# Patient Record
Sex: Female | Born: 1955 | Race: White | Hispanic: No | Marital: Married | Smoking: Former smoker
Health system: Southern US, Community
[De-identification: ages and names within clinical notes are randomized; demographics above are authoritative.]

## PROBLEM LIST (undated history)

## (undated) DIAGNOSIS — R42 Dizziness and giddiness: Secondary | ICD-10-CM

## (undated) DIAGNOSIS — N920 Excessive and frequent menstruation with regular cycle: Secondary | ICD-10-CM

## (undated) DIAGNOSIS — G43909 Migraine, unspecified, not intractable, without status migrainosus: Secondary | ICD-10-CM

## (undated) DIAGNOSIS — N951 Menopausal and female climacteric states: Secondary | ICD-10-CM

## (undated) DIAGNOSIS — R12 Heartburn: Secondary | ICD-10-CM

## (undated) HISTORY — PX: DILATION AND CURETTAGE OF UTERUS: SHX78

## (undated) HISTORY — PX: TONSILLECTOMY: SUR1361

## (undated) HISTORY — DX: Excessive and frequent menstruation with regular cycle: N92.0

## (undated) HISTORY — DX: Migraine, unspecified, not intractable, without status migrainosus: G43.909

## (undated) HISTORY — DX: Menopausal and female climacteric states: N95.1

## (undated) HISTORY — DX: Dizziness and giddiness: R42

## (undated) HISTORY — DX: Heartburn: R12

---

## 2004-11-26 ENCOUNTER — Emergency Department (HOSPITAL_COMMUNITY): Admission: EM | Admit: 2004-11-26 | Discharge: 2004-11-26 | Payer: Self-pay | Admitting: Emergency Medicine

## 2009-04-24 ENCOUNTER — Other Ambulatory Visit: Admission: RE | Admit: 2009-04-24 | Discharge: 2009-04-24 | Payer: Self-pay | Admitting: Obstetrics and Gynecology

## 2009-10-01 ENCOUNTER — Ambulatory Visit (HOSPITAL_COMMUNITY): Admission: RE | Admit: 2009-10-01 | Discharge: 2009-10-01 | Payer: Self-pay | Admitting: Interventional Cardiology

## 2009-10-01 ENCOUNTER — Encounter (INDEPENDENT_AMBULATORY_CARE_PROVIDER_SITE_OTHER): Payer: Self-pay | Admitting: Interventional Cardiology

## 2009-10-03 ENCOUNTER — Inpatient Hospital Stay (HOSPITAL_BASED_OUTPATIENT_CLINIC_OR_DEPARTMENT_OTHER): Admission: RE | Admit: 2009-10-03 | Discharge: 2009-10-03 | Payer: Self-pay | Admitting: Interventional Cardiology

## 2009-10-07 ENCOUNTER — Ambulatory Visit: Payer: Self-pay | Admitting: Thoracic Surgery (Cardiothoracic Vascular Surgery)

## 2009-10-26 HISTORY — PX: MITRAL VALVE REPAIR: SHX2039

## 2009-10-31 ENCOUNTER — Encounter: Payer: Self-pay | Admitting: Thoracic Surgery (Cardiothoracic Vascular Surgery)

## 2009-11-03 ENCOUNTER — Encounter
Admission: RE | Admit: 2009-11-03 | Discharge: 2009-11-03 | Payer: Self-pay | Admitting: Thoracic Surgery (Cardiothoracic Vascular Surgery)

## 2009-11-03 ENCOUNTER — Ambulatory Visit: Payer: Self-pay | Admitting: Thoracic Surgery (Cardiothoracic Vascular Surgery)

## 2009-11-04 ENCOUNTER — Inpatient Hospital Stay (HOSPITAL_COMMUNITY)
Admission: RE | Admit: 2009-11-04 | Discharge: 2009-11-08 | Payer: Self-pay | Admitting: Thoracic Surgery (Cardiothoracic Vascular Surgery)

## 2009-11-04 ENCOUNTER — Ambulatory Visit: Payer: Self-pay | Admitting: Thoracic Surgery (Cardiothoracic Vascular Surgery)

## 2009-11-04 ENCOUNTER — Encounter: Payer: Self-pay | Admitting: Thoracic Surgery (Cardiothoracic Vascular Surgery)

## 2009-11-17 ENCOUNTER — Encounter
Admission: RE | Admit: 2009-11-17 | Discharge: 2009-11-17 | Payer: Self-pay | Admitting: Thoracic Surgery (Cardiothoracic Vascular Surgery)

## 2009-11-17 ENCOUNTER — Ambulatory Visit: Payer: Self-pay | Admitting: Thoracic Surgery (Cardiothoracic Vascular Surgery)

## 2009-12-07 ENCOUNTER — Encounter: Admission: RE | Admit: 2009-12-07 | Discharge: 2009-12-07 | Payer: Self-pay | Admitting: Family Medicine

## 2010-01-16 ENCOUNTER — Ambulatory Visit: Payer: Self-pay | Admitting: Thoracic Surgery (Cardiothoracic Vascular Surgery)

## 2010-04-16 ENCOUNTER — Encounter: Admission: RE | Admit: 2010-04-16 | Discharge: 2010-04-16 | Payer: Self-pay | Admitting: Neurology

## 2010-04-20 ENCOUNTER — Ambulatory Visit: Payer: Self-pay | Admitting: Thoracic Surgery (Cardiothoracic Vascular Surgery)

## 2010-06-18 ENCOUNTER — Other Ambulatory Visit
Admission: RE | Admit: 2010-06-18 | Discharge: 2010-06-18 | Payer: Self-pay | Source: Home / Self Care | Admitting: Obstetrics and Gynecology

## 2010-07-13 ENCOUNTER — Encounter
Admission: RE | Admit: 2010-07-13 | Discharge: 2010-07-13 | Payer: Self-pay | Source: Home / Self Care | Attending: Obstetrics and Gynecology | Admitting: Obstetrics and Gynecology

## 2010-07-19 ENCOUNTER — Encounter: Payer: Self-pay | Admitting: Family Medicine

## 2010-09-15 LAB — TYPE AND SCREEN: Antibody Screen: NEGATIVE

## 2010-09-15 LAB — POCT I-STAT 4, (NA,K, GLUC, HGB,HCT)
Glucose, Bld: 114 mg/dL — ABNORMAL HIGH (ref 70–99)
Glucose, Bld: 122 mg/dL — ABNORMAL HIGH (ref 70–99)
Glucose, Bld: 132 mg/dL — ABNORMAL HIGH (ref 70–99)
HCT: 22 % — ABNORMAL LOW (ref 36.0–46.0)
HCT: 22 % — ABNORMAL LOW (ref 36.0–46.0)
HCT: 24 % — ABNORMAL LOW (ref 36.0–46.0)
HCT: 28 % — ABNORMAL LOW (ref 36.0–46.0)
HCT: 34 % — ABNORMAL LOW (ref 36.0–46.0)
Hemoglobin: 11.6 g/dL — ABNORMAL LOW (ref 12.0–15.0)
Hemoglobin: 8.2 g/dL — ABNORMAL LOW (ref 12.0–15.0)
Hemoglobin: 9.5 g/dL — ABNORMAL LOW (ref 12.0–15.0)
Potassium: 3.5 mEq/L (ref 3.5–5.1)
Potassium: 3.5 mEq/L (ref 3.5–5.1)
Potassium: 3.6 mEq/L (ref 3.5–5.1)
Sodium: 141 mEq/L (ref 135–145)
Sodium: 141 mEq/L (ref 135–145)
Sodium: 145 mEq/L (ref 135–145)

## 2010-09-15 LAB — BASIC METABOLIC PANEL
BUN: 14 mg/dL (ref 6–23)
BUN: 6 mg/dL (ref 6–23)
CO2: 23 mEq/L (ref 19–32)
CO2: 28 mEq/L (ref 19–32)
Calcium: 8.2 mg/dL — ABNORMAL LOW (ref 8.4–10.5)
Chloride: 113 mEq/L — ABNORMAL HIGH (ref 96–112)
Creatinine, Ser: 0.77 mg/dL (ref 0.4–1.2)
GFR calc Af Amer: >60 mL/min (ref >60)
GFR calc non Af Amer: >60 mL/min (ref >60)
Glucose, Bld: 109 mg/dL — ABNORMAL HIGH (ref 70–99)
Glucose, Bld: 126 mg/dL — ABNORMAL HIGH (ref 70–99)
Potassium: 4 mEq/L (ref 3.5–5.1)
Potassium: 4.2 mEq/L (ref 3.5–5.1)
Sodium: 139 mEq/L (ref 135–145)

## 2010-09-15 LAB — PROTIME-INR
INR: 1.28 (ref 0.00–1.49)
Prothrombin Time: 12.6 seconds (ref 11.6–15.2)
Prothrombin Time: 15.6 seconds — ABNORMAL HIGH (ref 11.6–15.2)
Prothrombin Time: 15.9 seconds — ABNORMAL HIGH (ref 11.6–15.2)

## 2010-09-15 LAB — CBC
HCT: 23.4 % — ABNORMAL LOW (ref 36.0–46.0)
HCT: 23.9 % — ABNORMAL LOW (ref 36.0–46.0)
HCT: 24.3 % — ABNORMAL LOW (ref 36.0–46.0)
HCT: 24.5 % — ABNORMAL LOW (ref 36.0–46.0)
HCT: 27.8 % — ABNORMAL LOW (ref 36.0–46.0)
HCT: 34.7 % — ABNORMAL LOW (ref 36.0–46.0)
Hemoglobin: 7.9 g/dL — ABNORMAL LOW (ref 12.0–15.0)
Hemoglobin: 8 g/dL — ABNORMAL LOW (ref 12.0–15.0)
Hemoglobin: 9.4 g/dL — ABNORMAL LOW (ref 12.0–15.0)
MCHC: 33.6 g/dL (ref 30.0–36.0)
MCHC: 33.8 g/dL (ref 30.0–36.0)
MCV: 85.8 fL (ref 78.0–100.0)
MCV: 86.7 fL (ref 78.0–100.0)
MCV: 87.1 fL (ref 78.0–100.0)
MCV: 87.5 fL (ref 78.0–100.0)
MCV: 87.9 fL (ref 78.0–100.0)
Platelets: 110 10*3/uL — ABNORMAL LOW (ref 150–400)
Platelets: 113 10*3/uL — ABNORMAL LOW (ref 150–400)
Platelets: 291 10*3/uL (ref 150–400)
RBC: 2.72 MIL/uL — ABNORMAL LOW (ref 3.87–5.11)
RBC: 3.2 MIL/uL — ABNORMAL LOW (ref 3.87–5.11)
RBC: 4.04 MIL/uL (ref 3.87–5.11)
RDW: 14.3 % (ref 11.5–15.5)
RDW: 14.7 % (ref 11.5–15.5)
RDW: 14.8 % (ref 11.5–15.5)
WBC: 11.8 10*3/uL — ABNORMAL HIGH (ref 4.0–10.5)
WBC: 4.9 10*3/uL (ref 4.0–10.5)
WBC: 7 10*3/uL (ref 4.0–10.5)
WBC: 7.5 10*3/uL (ref 4.0–10.5)

## 2010-09-15 LAB — CREATININE, SERUM
GFR calc Af Amer: >60 mL/min (ref >60)
GFR calc Af Amer: >60 mL/min (ref >60)

## 2010-09-15 LAB — COMPREHENSIVE METABOLIC PANEL
BUN: 10 mg/dL (ref 6–23)
CO2: 20 mEq/L (ref 19–32)
Chloride: 111 mEq/L (ref 96–112)
Creatinine, Ser: 0.78 mg/dL (ref 0.4–1.2)
GFR calc non Af Amer: >60 mL/min (ref >60)
Glucose, Bld: 92 mg/dL (ref 70–99)
Total Bilirubin: 0.3 mg/dL (ref 0.3–1.2)

## 2010-09-15 LAB — POCT I-STAT GLUCOSE
Glucose, Bld: 112 mg/dL — ABNORMAL HIGH (ref 70–99)
Glucose, Bld: 155 mg/dL — ABNORMAL HIGH (ref 70–99)

## 2010-09-15 LAB — POCT I-STAT 3, ART BLOOD GAS (G3+)
Acid-base deficit: 5 mmol/L — ABNORMAL HIGH (ref 0.0–2.0)
Bicarbonate: 22.3 mEq/L (ref 20.0–24.0)
Patient temperature: 35.4
Patient temperature: 36.8
TCO2: 22 mmol/L (ref 0–100)
TCO2: 24 mmol/L (ref 0–100)
pCO2 arterial: 40.2 mmHg (ref 35.0–45.0)
pH, Arterial: 7.282 — ABNORMAL LOW (ref 7.350–7.400)
pH, Arterial: 7.327 — ABNORMAL LOW (ref 7.350–7.400)

## 2010-09-15 LAB — HEMOGLOBIN A1C
Hgb A1c MFr Bld: 5.6 % (ref <5.7)
Mean Plasma Glucose: 114 mg/dL (ref <117)

## 2010-09-15 LAB — POCT I-STAT, CHEM 8
BUN: 11 mg/dL (ref 6–23)
Calcium, Ion: 1.21 mmol/L (ref 1.12–1.32)
Chloride: 112 mEq/L (ref 96–112)
Glucose, Bld: 139 mg/dL — ABNORMAL HIGH (ref 70–99)
TCO2: 22 mmol/L (ref 0–100)

## 2010-09-15 LAB — URINALYSIS, ROUTINE W REFLEX MICROSCOPIC
Bilirubin Urine: NEGATIVE
Hgb urine dipstick: NEGATIVE
Specific Gravity, Urine: 1.027 (ref 1.005–1.030)
pH: 7 (ref 5.0–8.0)

## 2010-09-15 LAB — BLOOD GAS, ARTERIAL
Bicarbonate: 21.7 mEq/L (ref 20.0–24.0)
Patient temperature: 98.6
TCO2: 22.6 mmol/L (ref 0–100)
pH, Arterial: 7.481 — ABNORMAL HIGH (ref 7.350–7.400)

## 2010-09-15 LAB — GLUCOSE, CAPILLARY
Glucose-Capillary: 109 mg/dL — ABNORMAL HIGH (ref 70–99)
Glucose-Capillary: 121 mg/dL — ABNORMAL HIGH (ref 70–99)
Glucose-Capillary: 144 mg/dL — ABNORMAL HIGH (ref 70–99)
Glucose-Capillary: 62 mg/dL — ABNORMAL LOW (ref 70–99)

## 2010-09-15 LAB — MAGNESIUM: Magnesium: 2.3 mg/dL (ref 1.5–2.5)

## 2010-09-15 LAB — ABO/RH: ABO/RH(D): O POS

## 2010-09-15 LAB — PLATELET COUNT: Platelets: 173 10*3/uL (ref 150–400)

## 2010-09-16 LAB — POCT I-STAT 3, VENOUS BLOOD GAS (G3P V)
Bicarbonate: 23.3 mEq/L (ref 20.0–24.0)
TCO2: 24 mmol/L (ref 0–100)
pCO2, Ven: 33.3 mmHg — ABNORMAL LOW (ref 45.0–50.0)
pCO2, Ven: 38.6 mmHg — ABNORMAL LOW (ref 45.0–50.0)
pH, Ven: 7.389 — ABNORMAL HIGH (ref 7.250–7.300)
pH, Ven: 7.434 — ABNORMAL HIGH (ref 7.250–7.300)

## 2010-11-10 NOTE — Assessment & Plan Note (Signed)
OFFICE VISIT   Melinda Bradley, Melinda Bradley  DOB:  Mar 15, 1956                                        April 20, 2010  CHART #:  [id number]   HISTORY OF PRESENT ILLNESS:  The patient returns for followup status  post right miniature thoracotomy for mitral valve repair on Nov 04, 2009.  She was last seen here in the office on January 16, 2010, at which  time, she was doing well.  She was having lots of problems with dizzy  spells and ultimately she was evaluated comprehensively by Dr. Kelli Hope at [facility].  She has been diagnosed  with, felt to be probable silent migraines and treated with Topamax.  She also changed her job at work and this has decreased the amount of  stress in her life.  Since then, she is doing much better and feeling  much better overall.  She denies any shortness of breath.  She has not  had any tachy palpitations.  She has not had any chest pain.  Her  exercise tolerance is good and she wants to go back to strenuous  physical activity on a regular basis.  Overall, she is doing much better  now and feels much better.  The remainder of her review of systems is  unremarkable.  The remainder of her past medical history is unchanged.  She reportedly had a followup echocardiogram performed at Dr. Hoyle Barr  office.  The results of this are not currently available.  She was told  that her heart and valve look very good.   PHYSICAL EXAMINATION:  Notable for a well-appearing female with blood  pressure 145/95, pulse 67, and oxygen saturation 100%.  Examination of  the chest reveals clear breath sounds that are symmetrical bilaterally.  Her mini thoracotomy incision has healed nicely.  Auscultation of the  heart is notable for regular rate and rhythm with crisp heart sounds.  No murmurs, rubs, or gallops are noted.  The abdomen is soft and  nontender.  The extremities are warm and well perfused.   IMPRESSION:  The patient is  doing well approximately 5 months status  post mitral valve repair.   PLAN:  In the future, the patient will call and return to see [place] as  needed.  All of her questions have been addressed.   Salvatore Decent. Cornelius Moras, M.D.  Electronically Signed   CHO/MEDQ  D:  04/20/2010  T:  04/20/2010  Job:  [id number]   cc:   Corky Crafts, MD  Sigmund Hazel, M.D.  Michael L. Thad Ranger, M.D.

## 2010-11-10 NOTE — Assessment & Plan Note (Signed)
OFFICE VISIT   Melinda Bradley, Melinda Bradley  DOB:  [birth year]                                        January 16, 2010  CHART #:  [id number]   HISTORY OF PRESENT ILLNESS:  The patient returns for followup status  post right miniature thoracotomy for mitral valve repair on Nov 04, 2009.  She has done very well postoperatively and was last seen here in  the office on Nov 17, 2009.  Since then, she has done extremely well.  She is delighted with her progress and she reports now that she did not  realize how much trouble she was having prior to surgery.  She states  that her exercise tolerance is much improved and she is eager to  increase her activity further.  She did have more of her dizzy spells  early after surgery and ultimately she was seen by a neurologist.  She  is diagnosed with what is felt to be silent migraine headaches and she  has just recently been started on Topamax.  She otherwise feels fine.  She has no shortness of breath.  She has no tachy palpitations.  The  remainder of her review of systems is unremarkable.   PHYSICAL EXAMINATION:  Notable for well-appearing female with blood  pressure 144/97, pulse 80 and regular, and oxygen saturation 98% on room  air.  Examination of the chest reveals a mini thoracotomy incision that  has healed nicely.  Auscultation reveals clear breath sounds that are  symmetrical bilaterally.  Cardiovascular exam includes regular rate and  rhythm.  No murmurs, rubs, or gallops noted.  The abdomen is soft and  nontender.  The extremities are warm and well perfused.  There is no  lower extremity edema.   IMPRESSION:  The patient has done very well following mitral valve  repair.   PLAN:  I have encouraged the patient to continue to increase her  physical activity without any particular limitations at this time.  All  of her questions have been addressed.  We will plan to see her back in 3  months for followup.  At some point  before then, we would like to see a  followup  echocardiogram, although she clearly is doing well and she does not have  a murmur on physical exam.   Salvatore Decent. Cornelius Moras, M.D.  Electronically Signed   CHO/MEDQ  D:  01/16/2010  T:  01/17/2010  Job:  [id number]   cc:   Corky Crafts, MD  Sigmund Hazel, M.D.

## 2010-11-10 NOTE — H&P (Signed)
HISTORY AND PHYSICAL EXAMINATION   October 07, 2009   Re:  Melinda Bradley, Melinda Bradley        DOB:  1955/07/24   REASON FOR CONSULTATION:  Severe mitral regurgitation.   HISTORY OF PRESENT ILLNESS:  The patient is a 55 year old married white  female from [place] with history of mitral valve prolapse and mitral  regurgitation.  She originally presented 2 years ago, prior to which  time she had a total of 2 severe dizzy spells including one in 2007 and  one in 2009.  She underwent an extensive workup but no obvious  explanation for her severe dizzy spells was ever identified.  Following  her workup in 2009 she was noted to have a heart murmur on physical  exam.  An echocardiogram was performed demonstrating mitral valve  prolapse with severe mitral regurgitation.  She was initially seen by a  cardiologist at [facility], and long-term medical  therapy was recommended.  More recently, she was seen by her primary  care physician here in [place], Dr. Sigmund Bradley.  She was referred to  Dr. Lance Bradley for follow up of mitral valve prolapse with severe  mitral regurgitation.  Transthoracic echocardiogram was performed in  their office, confirming the presence of mitral valve prolapse with  severe mitral regurgitation.  The patient subsequently underwent  transesophageal echocardiogram on October 01, 2009.  This confirmed the  presence of a flail segment of the posterior leaflet with severe (4+)  mitral regurgitation.  Left ventricular cavity size was normal.  Left  ventricular systolic function is normal.  No other abnormalities were  noted.  The patient subsequently underwent left and right heart  catheterization by Dr. Eldridge Bradley on October 03, 2009.  This demonstrated the  presence of normal coronary artery anatomy with no significant coronary  artery disease.  Pulmonary artery pressures measured 27/19 with  pulmonary capillary wedge pressure of 17.   Aortogram of the descending  thoracic and abdominal aorta was notable for the absence of any  significant aortoiliac disease.  Left ventricular function was normal,  but there was severe (4+) mitral regurgitation.  The patient has been  referred for possible elective mitral valve repair.  Of note, over the  last several months the patient has developed progressive symptoms of  exertional shortness of breath.  Initially, she noted only shortness of  breath with relatively strenuous activity such as hurrying or walking at  a fast pace while she is at work.  However, over the last 2 weeks she  also developed some resting shortness of breath and orthopnea.  For  several nights, she had sleep sitting straight up in a chair.  She was  started on oral diuretic therapy, and her shortness of breath has  improved.   REVIEW OF SYSTEMS:  GENERAL:  The patient otherwise reports feeling  well.  She does complain of exertional fatigue.  She has normal  appetite.  She has not been gaining nor losing weight recently.  She is  5 feet 6 inches tall and weighs approximately 152 pounds.  CARDIAC:  Notable for a fairly rapid acceleration of symptoms of  shortness of breath over the last several weeks culminating in episodes  of resting shortness of breath, PND, and orthopnea that have now abated  since she was started on oral diuretic therapy.  She has not had any  lower extremity edema.  She has not had any recent dizzy spells or  syncopal episodes.  She  does have occasional palpitations.  RESPIRATORY:  Notable for the absence of any productive cough,  hemoptysis, wheezing.  GASTROINTESTINAL:  Negative.  The patient has no difficulty swallowing.  She denies hematochezia, hematemesis, melena.  GENITOURINARY:  Negative.  PERIPHERAL VASCULAR:  Negative.  NEUROLOGIC:  Notable for some history of migraine headaches.  She states  that these have been better on long-term hormonal replacement therapy,  but she  still gets 1 or 2 migraine headaches every month.  She has not  had any further dizzy spells since her last major episode in 2009.  She  denies any episodes of transient monocular blindness.  She denies any  episodes of other visual disturbances.  She denies any transient  numbness or weakness involving either upper or lower extremity.  MUSCULOSKELETAL:  Negative.  PSYCHIATRIC:  Negative.  HEENT:  Negative.  The patient has good dentition and sees a dentist on  a regular basis.   PAST MEDICAL HISTORY:  1. Mitral valve prolapse with severe mitral regurgitation.  2. Congestive heart failure, acute systolic.  3. Migraine headaches.   PAST SURGICAL HISTORY:  1. Tonsillectomy.  2. D and C.  3. Bilateral tubal ligation.   Family history is notable in that the patient's sister had mitral valve  repair or replacement many years ago.   SOCIAL HISTORY:  The patient is married and lives with her husband here  in [place].  She works as a Radiographer, therapeutic at [employer].  She has 3 grown children.  She has a long-  standing history of tobacco abuse and reports smoking one-half pack of  cigarettes per day for probably 30 years.  She denies any significant  alcohol consumption.   CURRENT MEDICATIONS:  1. Sumatriptan 100 mg as needed for migraine headache.  2. Imitrex 50 mg as needed for migraine headache.  3. Ortho Micronor 0.35 mg daily.  4. Lasix 40 mg daily (recently started).   DRUG ALLERGIES:  None known.   PHYSICAL EXAM:  The patient is a well-appearing female who appears her  stated age in no acute distress.  Blood pressure 125/84, pulse 80,  oxygen saturation 90s 9% on room air.  HEENT exam is unrevealing.  The  neck is supple.  There is no cervical nor supraclavicular  lymphadenopathy.  There is no jugular venous distention.  No carotid  bruits are noted.  Auscultation of the chest reveals clear breath sounds  which are symmetrical  bilaterally.  No wheezes, rales, or rhonchi are  noted.  Cardiovascular exam demonstrates regular rate and rhythm.  There  is a prominent grade 4/6 holosystolic murmur heard best at the apex with  radiation all across the precordium into the back.  No diastolic murmurs  are noted.  The abdomen is soft, nondistended, nontender.  There are no  palpable masses.  The extremities are warm and well perfused.  There is  no lower extremity edema.  Femoral pulses are palpable.  Distal pulses  are palpable in the posterior tibial position.  The skin is clean, dry,  healthy appearing throughout.  Rectal and GU exams are both deferred.  Neurologic examination is grossly nonfocal and symmetrical throughout.   DIAGNOSTIC TESTS:  Transesophageal echocardiogram performed on October 01, 2009, by Dr. Eldridge Bradley is reviewed.  This demonstrates myxomatous  degenerative disease of the mitral valve with severe prolapse of the  posterior leaflet with a flail segment of the middle scallop (P2) and  severe (4+) essentially wide  open mitral regurgitation.  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  However, the anterior leaflet does  not appear to be prolapsing significantly.  There is normal left  ventricular size and function.  The aortic valve is normal.  There is  trivial tricuspid regurgitation.  No other abnormalities are noted.  Left and right heart catheterization performed on October 03, 2009, is also  reviewed.  Results are as discussed previously.  There is normal  coronary artery anatomy with no significant coronary artery disease.   IMPRESSION:  Mitral valve prolapse with severe mitral regurgitation with  recent progression of symptoms of congestive heart failure.  I agree  that the patient would best be treated with mitral valve repair.  I feel  that she is a good candidate for use of minimally invasive approach.   PLAN:  I  have discussed options at length with the patient here in the  office today.  The rationale for proceeding with surgery has been  discussed.  I am hopeful that her valve should be easily repairable.  If  it cannot be repairable, we would have to replace it using a mechanical  prosthesis, this would be unlikely.  We have discussed the rationale and  associated risks and benefits of minimally invasive approach versus a  conventional median sternotomy.  All of their questions have been  addressed.  She understands and accepts all potential associated risks  of surgery including but not limited to risk of death, stroke,  myocardial infarction, congestive heart failure, respiratory failure,  pneumonia, bleeding requiring blood transfusion, arrhythmia, heart block  with bradycardia requiring permanent pacemaker, late complications  related to valve repair, potential complications related to minimally  invasive approach including femoral artery cannulation.  All of her  questions have been addressed.  I have given her a prescription for  amiodarone to begin 1 week prior to surgery to decrease her risk of  perioperative arrhythmias.  We will plan to see her back in the office  on Nov 03, 2009, and I have asked her to have her husband accompany her  for consultation prior to surgery.  We plan to proceed with surgery on  Tuesday, Nov 04, 2009.  All of her questions have been answered.   Salvatore Decent. Cornelius Moras, M.D.  Electronically Signed   CHO/MEDQ  D:  10/07/2009  T:  10/08/2009  Job:  [id number]

## 2010-11-10 NOTE — Assessment & Plan Note (Signed)
OFFICE VISIT   Melinda Bradley, Melinda Bradley  DOB:  [birth year]                                        Nov 17, 2009  CHART #:  [id number]   HISTORY:  The patient returns to the office today for routine followup  status post right miniature thoracotomy for mitral valve repair on Nov 04, 2009.  She has done very well postoperatively.  Following hospital  discharge, she has continued to do well.  She was seen in followup  earlier today by Dr. Eldridge Dace and she returns to our office for routine  followup as well.  She has had a prothrombin time checked and Coumadin  dose adjusted through Dr. Hoyle Barr office.  She is feeling quite well.  Last week after she went home, she did have an episode where she got  dizzy and lightheaded and felt tired and somewhat confused.  She was  seen in Dr. Hoyle Barr office and noted to be somewhat dehydrated.  Lasix and potassium were stopped at that time.  Since then, she has felt  much better and she has not had any other problems at all.  She has very  mild residual soreness in the right side of her chest.  She only uses a  single Vicodin tablet at night to help her rest.  She is otherwise not  using any pain medicine at all.  Her appetite is good.  She has no  shortness of breath.  Her activity level is continuing to increase.  She  has no complaints at all.   PHYSICAL EXAMINATION:  Notable for well-appearing female with blood  pressure 103/75, pulse 76, and oxygen saturation 99% on room air.  Examination of the chest is notable for miniature thoracotomy incision  that is healing very nicely.  Chest tube sutures are removed.  Breath  sounds are clear to auscultation and symmetrical bilaterally.  No  wheezes, rales, or rhonchi are noted.  Cardiovascular exam includes  regular rate and rhythm.  No murmurs, rubs, or gallops are appreciated.  The abdomen is soft and nontender.  The extremities are warm and well  perfused.  The right groin  incision is healing nicely.  There is no  lower extremity edema.  The remainder of her physical exam is  unremarkable.   DIAGNOSTIC TESTS:  Chest x-ray performed today at the [facility] is reviewed.  This demonstrates clear lung fields bilaterally.  There are no pleural effusions.  No other significant abnormalities are  noted.   IMPRESSION:  The patient is doing very well just 2 weeks status post  right miniature thoracotomy for mitral valve repair.   PLAN:  I have encouraged the patient to continue to gradually increase  her physical activity as tolerated.  I think that within the next week  she should be able to drive an automobile if she continues to improve.  I have cautioned her to avoid any heavy lifting or strenuous use of her  arms or shoulders for the time being.  Otherwise, she can continue to  gradually increase her physical activity.  All of her questions have  been addressed.  We have not made any changes in her current  medications.  We will plan to see her back in 8 weeks for further  followup.   Salvatore Decent. Cornelius Moras, M.D.  Electronically Signed  CHO/MEDQ  D:  11/17/2009  T:  11/18/2009  Job:  [id number]   cc:   Corky Crafts, MD  Sigmund Hazel, M.D.

## 2010-11-10 NOTE — Assessment & Plan Note (Signed)
OFFICE VISIT   VICTORIYA, POL  DOB:  Nov 21, 1955                                        Nov 03, 2009  CHART #:  [id number]   The patient returns for followup with tentative plans to proceed with  surgery tomorrow.  She was originally seen in consultation on October 07, 2009 and a full consultation note and history and physical exam was  dictated at that time.  She now returns with her husband present for  further consultation.  All of her routine preoperative lab work looks  normal.  Preoperative chest x-ray was slightly abnormal, and this  prompted a chest CT scan to be performed this morning.  This confirmed  the presence of normal exam with no significant pulmonary parenchymal  lesions appreciated.  I reviewed these results with the patient and her  husband.  We also reviewed at length the indications, risks, and  potential benefits of elective mitral valve repair.  Surgical  alternatives were discussed.  All associated risks of surgery were  discussed in detail and all their questions have been answered.  We  spent in excess of 30 minutes.  We plan to proceed with surgery  tomorrow.   Salvatore Decent. Cornelius Moras, M.D.  Electronically Signed   CHO/MEDQ  D:  11/03/2009  T:  11/04/2009  Job:  [id number]

## 2011-06-17 ENCOUNTER — Other Ambulatory Visit (HOSPITAL_COMMUNITY)
Admission: RE | Admit: 2011-06-17 | Discharge: 2011-06-17 | Disposition: A | Discharge status: Home / Self Care | Payer: 59 | Source: Ambulatory Visit | Attending: Obstetrics and Gynecology | Admitting: Obstetrics and Gynecology

## 2011-06-17 ENCOUNTER — Other Ambulatory Visit: Payer: Self-pay | Admitting: Nurse Practitioner

## 2011-06-17 DIAGNOSIS — Z01419 Encounter for gynecological examination (general) (routine) without abnormal findings: Secondary | ICD-10-CM | POA: Insufficient documentation

## 2011-09-26 IMAGING — CT CT CHEST W/O CM
3 of 4 series · 17 of 30 positions shown, 19 images · non-contrast
Comparison: Chest x-ray 10/31/2009

CLINICAL DATA: Preop mitral valve surgery 11/04/2009.  Evaluate
right upper lobe nodular density seen on preoperative chest x-ray.
Shortness of breath.

CT CHEST WITHOUT CONTRAST
TECHNIQUE: Multidetector CT imaging of the chest was performed
following the standard protocol without IV contrast.

[Series 3: routine chest · axial · 0.70mm/px · z∈[-211,-11]mm · 5 of 60 slices shown, 7 images]
[im 10/60  mediastinal]
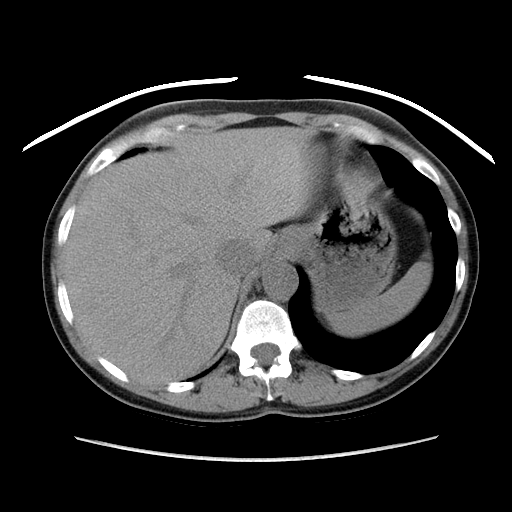
[im 10/60  lung]
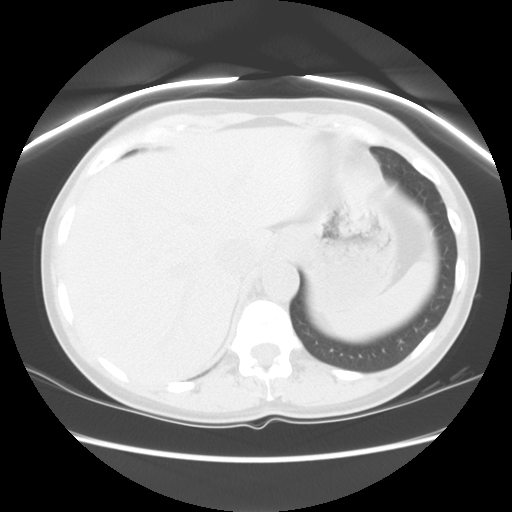
[im 20/60  lung]
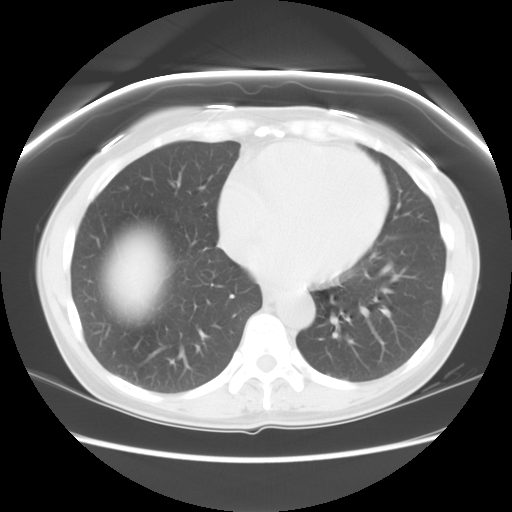
[im 30/60  lung]
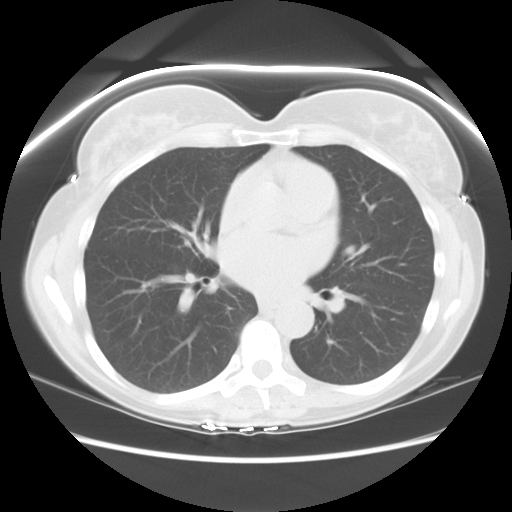
[im 40/60  lung]
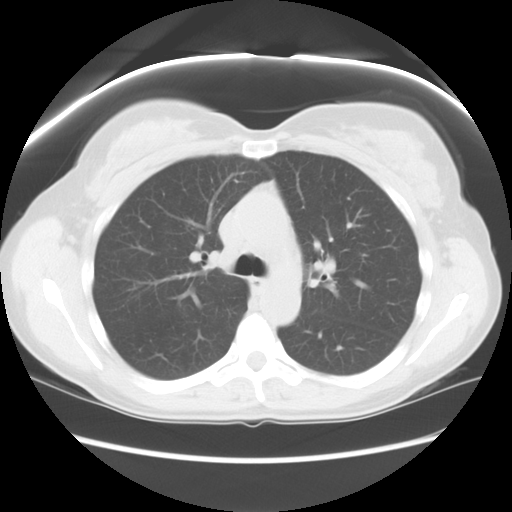
[im 50/60  mediastinal]
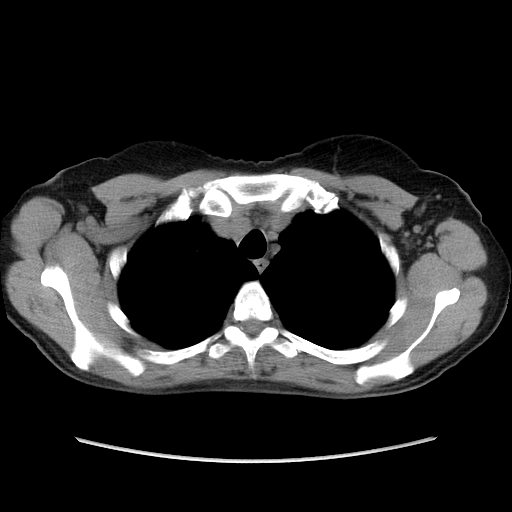
[im 50/60  lung]
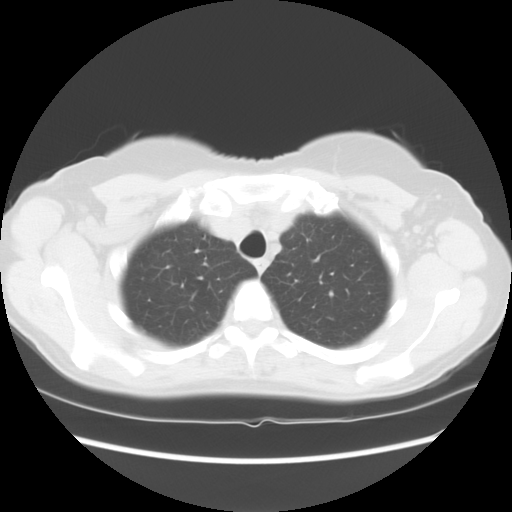

[Series 4: lung windows · axial · 0.70mm/px · z∈[-191,-21]mm · 4 of 58 slices shown]
[im 12/58  lung]
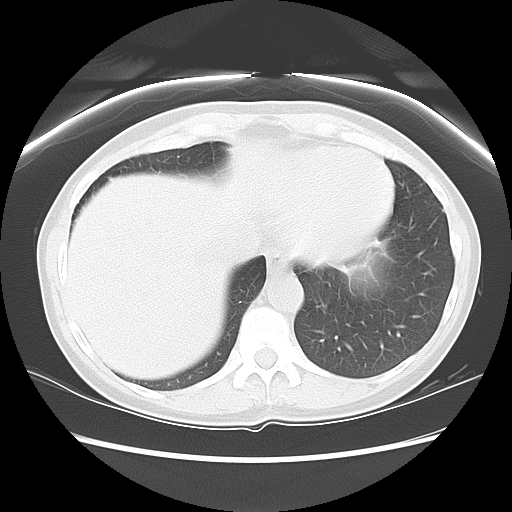
[im 23/58  lung]
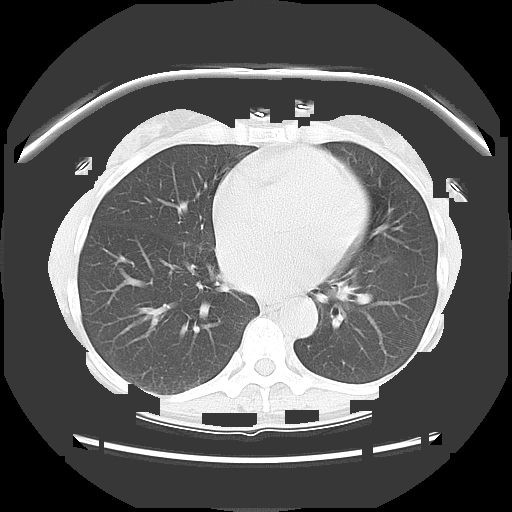
[im 35/58  lung]
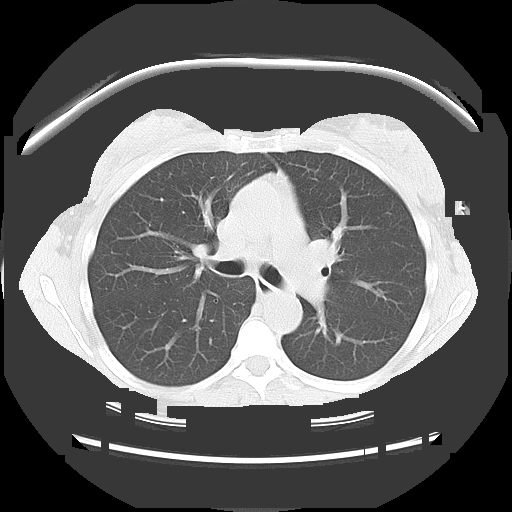
[im 46/58  lung]
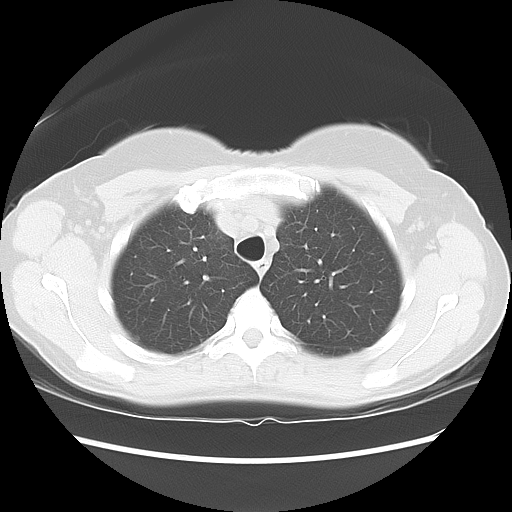

[Series 602: sagittal body · sagittal · 0.70mm/px · 8 of 145 slices shown]
[im 11/145  mediastinal]
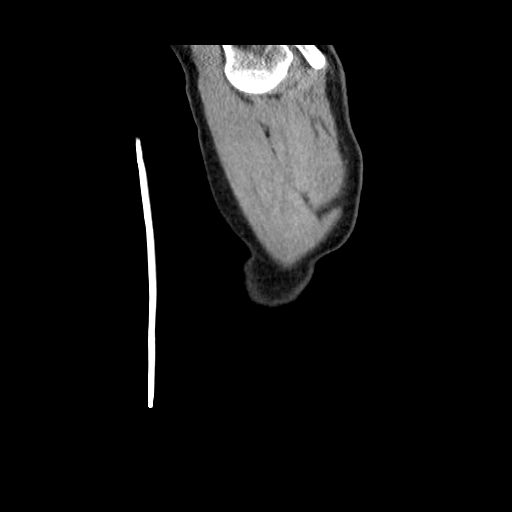
[im 31/145  mediastinal]
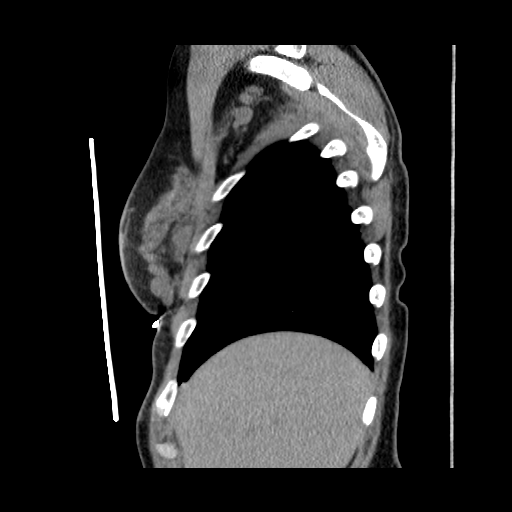
[im 52/145  mediastinal]
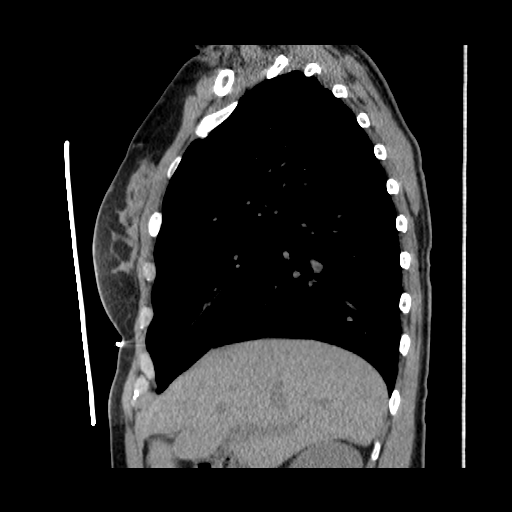
[im 62/145  mediastinal]
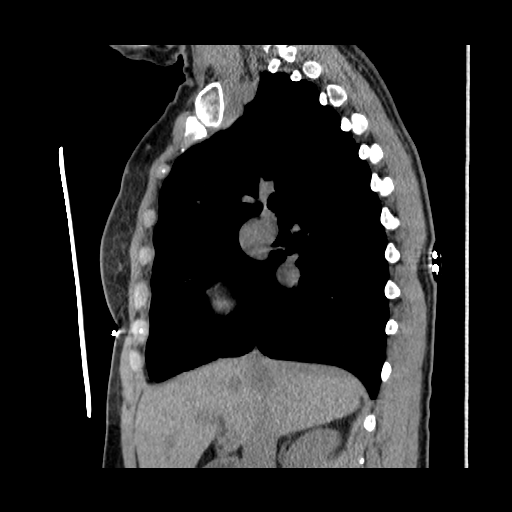
[im 83/145  mediastinal]
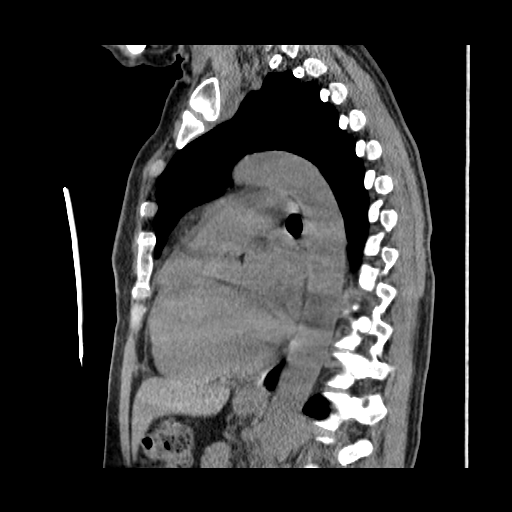
[im 93/145  mediastinal]
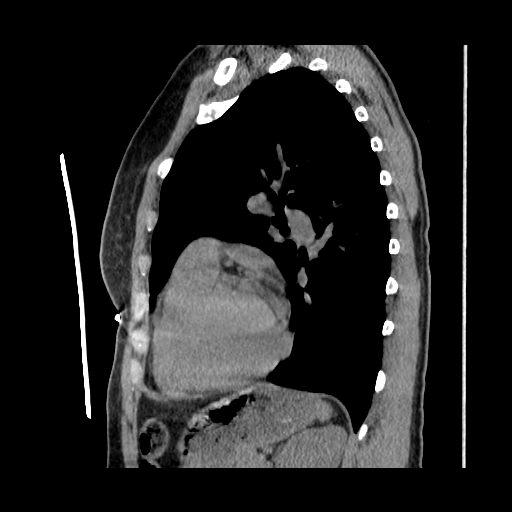
[im 114/145  mediastinal]
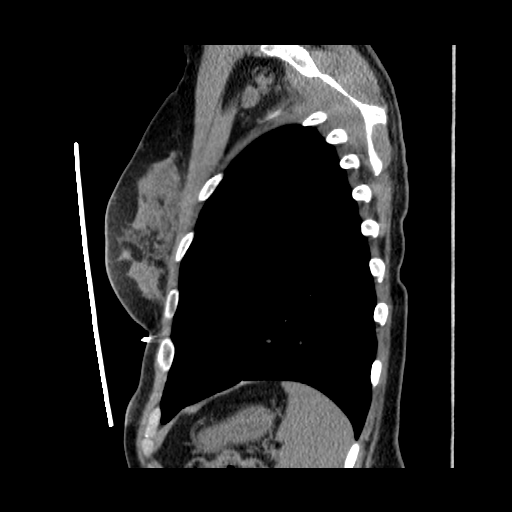
[im 134/145  mediastinal]
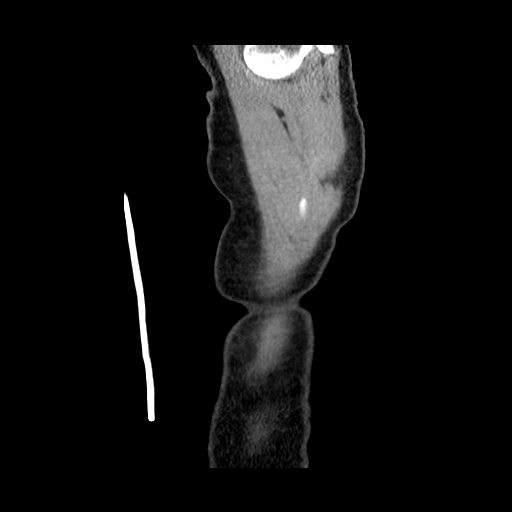

[17 of 30 positions shown; findings below may reference images not displayed]

FINDINGS: No pathologically enlarged mediastinal or axillary lymph
nodes.  Hilar regions are difficult to definitively evaluate
without IV contrast.  Heart size normal.  No pericardial effusion.
There are nodular areas in both breasts.  The largest on the right
measures 1.7 cm, laterally.  On the left, confluent breast tissue
or a nodule is seen in the lower left breast.

Mild biapical pleural parenchymal scarring.  Small calcified
granuloma in the anterior right upper lobe.  Lungs are otherwise
clear.  No pleural fluid.  Airway is unremarkable.

Incidental imaging of the upper abdomen shows no acute findings.
No worrisome lytic or sclerotic lesions.
IMPRESSION: 1.  No worrisome pulmonary nodule to account for the questioned
abnormality on recent chest x-ray.
2.  Nodular breast tissue is seen bilaterally.  Correlation with
physical exam and mammography would be helpful in further
evaluation, as clinically indicated.  This report was called to
Dimitris Chatzinikolas, in Dr. [REDACTED], at [DATE] on 11/03/2009, by
Zhicheng.

## 2012-06-27 ENCOUNTER — Other Ambulatory Visit: Payer: Self-pay | Admitting: Obstetrics and Gynecology

## 2012-06-27 ENCOUNTER — Other Ambulatory Visit (HOSPITAL_COMMUNITY)
Admission: RE | Admit: 2012-06-27 | Discharge: 2012-06-27 | Disposition: A | Discharge status: Home / Self Care | Payer: 59 | Source: Ambulatory Visit | Attending: Obstetrics and Gynecology | Admitting: Obstetrics and Gynecology

## 2012-06-27 DIAGNOSIS — N6325 Unspecified lump in the left breast, overlapping quadrants: Secondary | ICD-10-CM

## 2012-06-27 DIAGNOSIS — N6321 Unspecified lump in the left breast, upper outer quadrant: Secondary | ICD-10-CM

## 2012-06-27 DIAGNOSIS — Z1151 Encounter for screening for human papillomavirus (HPV): Secondary | ICD-10-CM | POA: Insufficient documentation

## 2012-06-27 DIAGNOSIS — Z01419 Encounter for gynecological examination (general) (routine) without abnormal findings: Secondary | ICD-10-CM | POA: Insufficient documentation

## 2012-07-04 ENCOUNTER — Other Ambulatory Visit: Payer: 59

## 2013-03-16 ENCOUNTER — Encounter: Payer: Self-pay | Admitting: Nurse Practitioner

## 2013-03-19 ENCOUNTER — Ambulatory Visit (INDEPENDENT_AMBULATORY_CARE_PROVIDER_SITE_OTHER): Payer: 59 | Admitting: Nurse Practitioner

## 2013-03-19 ENCOUNTER — Encounter: Payer: Self-pay | Admitting: Nurse Practitioner

## 2013-03-19 VITALS — BP 136/94 | HR 62 | Ht 66.0 in | Wt 172.0 lb

## 2013-03-19 DIAGNOSIS — G43809 Other migraine, not intractable, without status migrainosus: Secondary | ICD-10-CM

## 2013-03-19 DIAGNOSIS — R42 Dizziness and giddiness: Secondary | ICD-10-CM

## 2013-03-19 MED ORDER — SUMATRIPTAN SUCCINATE 100 MG PO TABS: 100.0000 mg | ORAL_TABLET | ORAL | Status: DC | PRN

## 2013-03-19 MED ORDER — PROMETHAZINE HCL 25 MG PO TABS: 25.0000 mg | ORAL_TABLET | Freq: Four times a day (QID) | ORAL | Status: DC | PRN

## 2013-03-19 MED ORDER — TOPIRAMATE 25 MG PO TABS: 50.0000 mg | ORAL_TABLET | Freq: Two times a day (BID) | ORAL | Status: DC

## 2013-03-19 NOTE — Patient Instructions (Addendum)
Increase Topamax to 100mg  daily Increase Imitrex to 100 mg when necessary acute migraine, may cut in  Half Phenergan when necessary nausea F/U in 6 months

## 2013-03-19 NOTE — Progress Notes (Signed)
[facility]  PATIENT: Melinda Bradley DOB: 1956/06/17   REASON FOR VISIT: Followup for migraine   HISTORY OF PRESENT ILLNESS:Melinda Bradley, 57 year old returns for follow-up. She was last seen 10/06/11.  She was evaluated for "dizzy spells" since 2007-by Dr. Thad Ranger on 01/12/10.  One severe enough to fall out of chair; went to ED, told had panic attack- went away until 2009, recurred, again negative w/u- found to have murmur at that time, eventually has mitral valve repair May 2011- no further spells from 2009 until June, recurred- had several since- can occur at rest, even in sleep- feel unsteady, sweaty, flushed, then very tired for rest of day and next- room spins counterclockwise, nausea after, no subjective nystagmus, no visual change- lasts about and resolves- helps to sit still, use cold rags- no ALOC- no assoc HA; long h/o migraine, better w/ hormone- on metoprolol since surg, just stopped Coumadin- She fell out of the chair with another episode 02/25/10.  Topamax was increased to 100mg  daily. EEG was done and was normal.  She has migraines and doing well on Topamax. 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  CT angiogram normal in the past.  No further events with Topamax.   03/19/13: Patient returns for followup she had a severe migraine last week associated with nausea and vomiting. For whatever reason she has decreased her Topamax to 75 mg daily. Imitrex works acutely but typically she has to take an additional tablet and her pills are 50 mg. She has nothing to take for nausea. She is on Wellbutrin for depression and Prozac was recently added. Her daughter is getting married in 2 weeks and this is stressful for her. She is also going through menopause and has difficulty sleeping some nights.  REVIEW OF SYSTEMS: Full 14 system review of systems performed and  notable only for:  Constitutional: Weight gain fatigue  Cardiovascular: N/A  Ear/Nose/Throat: Spinning sensation Skin: N/A  Eyes: N/A  Respiratory: N/A  Gastroitestinal: N/A  Hematology/Lymphatic: N/A  Endocrine: Filling hot/cold, increased thirst  Musculoskeletal:N/A  Allergy/Immunology: N/A  Neurological: Migraine  Psychiatric: N/A   ALLERGIES: No Known Allergies  HOME MEDICATIONS: Outpatient Prescriptions Prior to Visit  Medication Sig Dispense Refill  . aspirin 81 MG chewable tablet Chew 81 mg by mouth daily.      Marland Kitchen buPROPion (WELLBUTRIN XL) 300 MG 24 hr tablet Take 300 mg by mouth daily.      . metoprolol tartrate (LOPRESSOR) 25 MG tablet Take 25 mg by mouth 2 (two) times daily.      Marland Kitchen topiramate (TOPAMAX) 25 MG tablet Take 25 mg by mouth 2 (two) times daily.      . norethindrone (ORTHO MICRONOR) 0.35 MG tablet Take 1 tablet by mouth daily.       No facility-administered medications prior to visit.    PAST MEDICAL HISTORY: Past Medical History  Diagnosis Date  . Migraine   . Vertigo   . Heart burn     PAST SURGICAL HISTORY: Past Surgical History  Procedure Laterality Date  . Mitral valve repair  10/2009    FAMILY HISTORY: Family History  Problem Relation Age of Onset  . Headache Father   . Heart Problems Father   . Hiatal hernia Mother     SOCIAL HISTORY: History   Social History  . Marital Status: Married    Spouse Name:  [name]    Number of Children: 3  . Years of Education: college   Occupational History  . 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 Smoking status: Former Smoker    Types: Cigarettes    Start date: 09/26/2009  . Smokeless tobacco: Never Used     Comment: Quit 2010  . Alcohol Use: 0.6 oz/week    1 Cans of beer per week     Comment: OCC  . Drug Use: No  . Sexual Activity: Not on file   Other Topics Concern  . Not on file   Social History Narrative   Patient lives at home with her husband.  [name])   Patient has 3 children.   Patient works as a Engineer, maintenance (IT) at [employer].    Patient has a college education.   Caffeine- two cups daily.   Right handed.     PHYSICAL EXAM  Filed Vitals:   03/19/13 0940  BP: 136/94  Pulse: 62  Height: 5\' 6"  (1.676 m)  Weight: 172 lb (78.019 kg)   Body mass index is 27.77 kg/(m^2).  Generalized: Well developed, in no acute distress  Head: normocephalic and atraumatic,. Oropharynx benign  Neck: Supple, no carotid bruits  Cardiac: Regular rate rhythm, no murmur   Neurological examination   Mentation: Alert oriented to time, place, history taking. Follows all commands speech and language fluent  Cranial nerve II-XII: Pupils were equal round reactive to light extraocular movements were full, visual field were full on confrontational test. Facial sensation and strength were normal. hearing was intact to finger rubbing bilaterally. Uvula tongue midline. head turning and shoulder shrug and were normal and symmetric.Tongue protrusion into cheek strength was normal. Motor: normal bulk and tone, full strength in the BUE, BLE, fine finger movements normal, no pronator drift. No focal weakness Coordination: finger-nose-finger, heel-to-shin bilaterally, no dysmetria Reflexes: Brachioradialis 2/2, biceps 2/2, triceps 2/2, patellar 2/2, Achilles 2/2, plantar responses were flexor bilaterally. Gait and Station: Rising up from seated position without assistance, normal stance, moderate stride, good arm swing, smooth turning, able to perform tiptoe, and heel walking without difficulty.   DIAGNOSTIC DATA (LABS, IMAGING, TESTING) None to review    ASSESSMENT AND PLAN  57 y.o. year old female  has a past medical history of Migraine; Vertigo; here for followup. One bad migraine last week where she had nausea and vomiting. She is currently taking Topamax 75 mg daily  Increase Topamax to 100mg  daily Increase Imitrex to 100 mg when necessary  acute migraine, may cut in  Half Phenergan when necessary nausea F/U in 6 months Nilda Riggs, [facility], [facility], APRN  [facility] [address] [place], [place] [address] [phone]

## 2013-03-19 NOTE — Progress Notes (Signed)
I have read the note, and I agree with the clinical assessment and plan.  WILLIS,CHARLES KEITH   

## 2013-04-27 ENCOUNTER — Other Ambulatory Visit: Payer: Self-pay

## 2013-04-27 NOTE — Telephone Encounter (Signed)
I called and left PT VM. I have request for refills for her imitrex but, it looks like it has been recently re-ordered. Please call and let me know if patient in need of more medication or am I to disregard this request.

## 2013-05-13 ENCOUNTER — Encounter: Payer: Self-pay | Admitting: *Deleted

## 2013-05-13 NOTE — Progress Notes (Signed)
This encounter was created in error - please disregard.

## 2013-05-14 ENCOUNTER — Encounter: Payer: Self-pay | Admitting: Interventional Cardiology

## 2013-05-14 ENCOUNTER — Encounter: Payer: Self-pay | Admitting: *Deleted

## 2013-05-15 ENCOUNTER — Ambulatory Visit (INDEPENDENT_AMBULATORY_CARE_PROVIDER_SITE_OTHER): Payer: 59 | Admitting: Interventional Cardiology

## 2013-05-15 ENCOUNTER — Encounter: Payer: Self-pay | Admitting: Interventional Cardiology

## 2013-05-15 VITALS — BP 120/84 | HR 66 | Ht 67.0 in | Wt 176.0 lb

## 2013-05-15 DIAGNOSIS — I059 Rheumatic mitral valve disease, unspecified: Secondary | ICD-10-CM

## 2013-05-15 NOTE — Progress Notes (Signed)
Patient ID: Melinda Bradley, female   DOB: [birth year], 57 y.o.   MRN: [id number]    [address] [place], [place]  [address] Phone: [phone] Fax:  [phone]  Date:  05/15/2013   ID:  Melinda Bradley, DOB 09-12-1955, MRN [id number]  PCP:  Neldon Labella, MD      History of Present Illness: Melinda Bradley is a 57 y.o. female who had a mitral valve repair. She has not been as active. SHe has gained some weight. DOE has been mild. She was started on Well butrin after her surgery. SHe was also having dizziness related to migraines. Improved with meds. She was exercising regularly. SHe has not lost any weight. She will get back to the gym. BP at home has been in the 120-140/80-92 range.    Wt Readings from Last 3 Encounters:  05/15/13 176 lb (79.833 kg)  03/19/13 172 lb (78.019 kg)     Past Medical History  Diagnosis Date  . Migraine   . Vertigo   . Heart burn   . Symptomatic menopausal or female climacteric states   . Menorrhagia     Current Outpatient Prescriptions  Medication Sig Dispense Refill  . aspirin 81 MG chewable tablet Chew 81 mg by mouth daily.      Marland Kitchen buPROPion (WELLBUTRIN XL) 300 MG 24 hr tablet Take 300 mg by mouth daily.      Marland Kitchen FLUoxetine (PROZAC) 20 MG tablet Take 20 mg by mouth daily.      . metoprolol tartrate (LOPRESSOR) 25 MG tablet Take 25 mg by mouth daily.       . promethazine (PHENERGAN) 25 MG tablet Take 1 tablet (25 mg total) by mouth every 6 (six) hours as needed for nausea.  30 tablet  0  . SUMAtriptan (IMITREX) 100 MG tablet Take 1 tablet (100 mg total) by mouth every 2 (two) hours as needed for migraine. May repeat in 2 hours if headache persists or recurs.  10 tablet  6  . topiramate (TOPAMAX) 25 MG tablet Take 2 tablets (50 mg total) by mouth 2 (two) times daily.  120 tablet  6   No current facility-administered medications for this visit.    Allergies:   No Known Allergies  Social History:  The patient  reports that  she has quit smoking. Her smoking use included Cigarettes. She started smoking about 3 years ago. She smoked 0.00 packs per day. She has never used smokeless tobacco. She reports that she drinks about 0.6 ounces of alcohol per week. She reports that she does not use illicit drugs.   Family History:  The patient's family history includes Headache in her father; Heart Problems in her father; Hiatal hernia in her mother.   ROS:  Please see the history of present illness.  No nausea, vomiting.  No fevers, chills.  No focal weakness.  No dysuria.    All other systems reviewed and negative. Sx of menopause  PHYSICAL EXAM: VS:  BP 120/84  Pulse 66  Ht 5\' 7"  (1.702 m)  Wt 176 lb (79.833 kg)  BMI 27.56 kg/m2 Well nourished, well developed, in no acute distress HEENT: normal Neck: no JVD, no carotid bruits Cardiac:  normal S1, S2; RRR;  Lungs:  clear to auscultation bilaterally, no wheezing, rhonchi or rales Abd: soft, nontender, no hepatomegaly Ext: no edema Skin: warm and dry Neuro:   no focal abnormalities noted  EKG:  NSR, lateral ST segment changes  ASSESSMENT  AND PLAN:  Shortness of breath on exertion  Will have her try to exercise more and lose some weight to try to improve stamina.  2. Elevated blood pressure reading without diagnosis of hypertension  Check BP at home. Readings should be below 140/90. If readings consistently above this, let us know. BP controlled today. 3. Mitral valve disorders  s/p MV repair. SHe takes SBE prophylaxis.     Signed, Fredric Mare, MD, [facility] 05/15/2013 4:41 PM

## 2013-05-15 NOTE — Patient Instructions (Signed)
Your physician recommends that you continue on your current medications as directed. Please refer to the Current Medication list given to you today.  Your physician wants you to follow-up in: 1 year with Dr. Varanasi. You will receive a reminder letter in the mail two months in advance. If you don't receive a letter, please call our office to schedule the follow-up appointment.  

## 2013-06-16 ENCOUNTER — Other Ambulatory Visit: Payer: Self-pay | Admitting: Interventional Cardiology

## 2013-06-18 NOTE — Telephone Encounter (Signed)
Is she supposed to take this qd or bid? Please advise. Thanks, MI

## 2013-06-18 NOTE — Telephone Encounter (Signed)
LMTRC TO VERIFY FREQUENCY

## 2013-06-20 ENCOUNTER — Other Ambulatory Visit: Payer: Self-pay

## 2013-06-20 MED ORDER — METOPROLOL TARTRATE 25 MG PO TABS: 25.0000 mg | ORAL_TABLET | Freq: Two times a day (BID) | ORAL | Status: DC

## 2013-09-19 ENCOUNTER — Ambulatory Visit (INDEPENDENT_AMBULATORY_CARE_PROVIDER_SITE_OTHER): Payer: 59 | Admitting: Nurse Practitioner

## 2013-09-19 ENCOUNTER — Encounter: Payer: Self-pay | Admitting: Nurse Practitioner

## 2013-09-19 VITALS — BP 136/91 | HR 67 | Ht 66.0 in | Wt 178.0 lb

## 2013-09-19 DIAGNOSIS — M79641 Pain in right hand: Secondary | ICD-10-CM | POA: Insufficient documentation

## 2013-09-19 DIAGNOSIS — R42 Dizziness and giddiness: Secondary | ICD-10-CM

## 2013-09-19 DIAGNOSIS — M79609 Pain in unspecified limb: Secondary | ICD-10-CM

## 2013-09-19 DIAGNOSIS — G43809 Other migraine, not intractable, without status migrainosus: Secondary | ICD-10-CM

## 2013-09-19 MED ORDER — TOPIRAMATE 25 MG PO TABS: 50.0000 mg | ORAL_TABLET | Freq: Two times a day (BID) | ORAL | Status: DC

## 2013-09-19 NOTE — Patient Instructions (Signed)
EMG nerve conduction for hand  Pain Continue Topamax at current dose Followup 6-8 months

## 2013-09-19 NOTE — Progress Notes (Signed)
[facility]  PATIENT: Melinda Bradley DOB: December 07, 1955   REASON FOR VISIT: follow up for migraines   HISTORY OF PRESENT ILLNESS: Melinda Bradley, 58 year old female returns for followup. Her migraines are in fairly good control with Topamax 100 mg daily. Imitrex works acutely. She has a new complaint of numbness, pain and  tingling in the right hand sometimes it wakes her up at night. 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 She has a history of mitral valve replacement in 2010. She has continued to gain weight even though she claims she exercises and eats healthy. She returns for reevaluation    HISTORY: She was evaluated for "dizzy spells" since 2007-by Dr. Thad Ranger on 01/12/10. One severe enough to fall out of chair; went to ED, told had panic attack- went away until 2009, recurred, again negative w/u- found to have murmur at that time, eventually has mitral valve repair May 2011- no further spells from 2009 until June, recurred- had several since- can occur at rest, even in sleep- feel unsteady, sweaty, flushed, then very tired for rest of day and next- room spins counterclockwise, nausea after, no subjective nystagmus, no visual change- lasts about and resolves- helps to sit still, use cold rags- no ALOC- no assoc HA; long h/o migraine, better w/ hormone- on metoprolol since surg, just stopped Coumadin- She fell out of the chair with another episode 02/25/10. Topamax was increased to 100mg  daily. EEG was done and was normal. She has migraines and doing well on Topamax. 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 CT angiogram normal in the past. No further events with Topamax.  03/19/13: Patient returns for followup she had a severe migraine last week associated with nausea and vomiting. For whatever reason she has decreased her Topamax to 75 mg daily. Imitrex works  acutely but typically she has to take an additional tablet and her pills are 50 mg. She has nothing to take for nausea. She is on Wellbutrin for depression and Prozac was recently added. Her daughter is getting married in 2 weeks and this is stressful for her. She is also going through menopause and has difficulty sleeping  REVIEW OF SYSTEMS: Full 14 system review of systems performed and notable only for those listed, all others are neg:  Constitutional: N/A  Cardiovascular: N/A  Ear/Nose/Throat: N/A  Skin: N/A  Eyes: N/A  Respiratory: N/A  Gastroitestinal: N/A  Hematology/Lymphatic: N/A  Endocrine: N/A Musculoskeletal: Joint pain, right hand pain Allergy/Immunology: N/A  Neurological: Headaches  Psychiatric: N/A   ALLERGIES: No Known Allergies  HOME MEDICATIONS: Outpatient Prescriptions Prior to Visit  Medication Sig Dispense Refill  . aspirin 81 MG chewable tablet Chew 81 mg by mouth daily.      Marland Kitchen buPROPion (WELLBUTRIN XL) 300 MG 24 hr tablet Take 300 mg by mouth daily.      Marland Kitchen FLUoxetine (PROZAC) 20 MG tablet Take 20 mg by mouth daily.      . metoprolol tartrate (LOPRESSOR) 25 MG tablet Take 1 tablet (25 mg total) by mouth 2 (two) times daily.  30 tablet  11  . SUMAtriptan (IMITREX) 100 MG tablet Take 1 tablet (100 mg total) by mouth every 2 (two) hours as needed for migraine. May repeat in 2 hours if headache persists or recurs.  10 tablet  6  . topiramate (TOPAMAX) 25 MG tablet Take 2 tablets (50  mg total) by mouth 2 (two) times daily.  120 tablet  6  . promethazine (PHENERGAN) 25 MG tablet Take 1 tablet (25 mg total) by mouth every 6 (six) hours as needed for nausea.  30 tablet  0   No facility-administered medications prior to visit.    PAST MEDICAL HISTORY: Past Medical History  Diagnosis Date  . Migraine   . Vertigo   . Heart burn   . Symptomatic menopausal or female climacteric states   . Menorrhagia     PAST SURGICAL HISTORY: Past Surgical History  Procedure  Laterality Date  . Mitral valve repair  10/2009  . Dilation and curettage of uterus    . Tonsillectomy      FAMILY HISTORY: Family History  Problem Relation Age of Onset  . Headache Father   . Heart Problems Father   . Hiatal hernia Mother     SOCIAL HISTORY: History   Social History  . Marital Status: Married    Spouse Name: [name]    Number of Children: 3  . Years of Education: college   Occupational History  . 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 Smoking status: Former Smoker    Types: Cigarettes    Start date: 09/26/2009  . Smokeless tobacco: Never Used     Comment: Quit 2010  . Alcohol Use: 0.6 oz/week    1 Cans of beer per week     Comment: OCC  . Drug Use: No  . Sexual Activity: Not on file   Other Topics Concern  . Not on file   Social History Narrative   Patient lives at home with her husband. Melinda Bradley)   Patient has 3 children.   Patient works as a Engineer, maintenance (IT) at [employer].    Patient has a college education.   Caffeine- two cups daily.   Right handed.     PHYSICAL EXAM  Filed Vitals:   09/19/13 1519  BP: 136/91  Pulse: 67  Height: 5\' 6"  (1.676 m)  Weight: 178 lb (80.74 kg)   Body mass index is 28.74 kg/(m^2).  Generalized: Well developed, mildly obese female in no acute distress  Head: normocephalic and atraumatic,. Oropharynx benign  Neck: Supple, no carotid bruits  Cardiac: Regular rate rhythm, no murmur  Musculoskeletal: No deformity   Neurological examination   Mentation: Alert oriented to time, place, history taking. Follows all commands speech and language fluent  Cranial nerve II-XII: Pupils were equal round reactive to light extraocular movements were full, visual field were full on confrontational test. Facial sensation and strength were normal. hearing was intact to finger rubbing bilaterally. Uvula tongue midline. head turning and shoulder shrug were normal and symmetric.Tongue  protrusion into cheek strength was normal. Motor: normal bulk and tone, full strength in the BUE, BLE,. No focal weakness. Positive tinels on the right Coordination: finger-nose-finger, heel-to-shin bilaterally, no dysmetria Reflexes: Brachioradialis 2/2, biceps 2/2, triceps 2/2, patellar 2/2, Achilles 2/2, plantar responses were flexor bilaterally. Gait and Station: Rising up from seated position without assistance, normal stance,  moderate stride, good arm swing, smooth turning, able to perform tiptoe, and heel walking without difficulty. Tandem gait is steady  DIAGNOSTIC DATA (LABS, IMAGING, TESTING) -  ASSESSMENT AND PLAN  58 y.o. year old female  has a past medical history of Migraine; Vertigo; and now right hand pain, numbness and tingling.   Schedule nerve conduction for hand  Pain and if necessary EMG Continue Topamax at  current dose, refilled Followup 6-8 months Nilda RiggsNancy Carolyn Martin, [facility], [facility], APRN  [facility] [address] [place], [place] [address] [phone]

## 2013-09-19 NOTE — Progress Notes (Signed)
I have read the note, and I agree with the clinical assessment and plan.  Melinda Bradley   

## 2013-10-02 ENCOUNTER — Ambulatory Visit (INDEPENDENT_AMBULATORY_CARE_PROVIDER_SITE_OTHER): Payer: 59 | Admitting: Neurology

## 2013-10-02 ENCOUNTER — Encounter (INDEPENDENT_AMBULATORY_CARE_PROVIDER_SITE_OTHER): Payer: Self-pay

## 2013-10-02 DIAGNOSIS — M79641 Pain in right hand: Secondary | ICD-10-CM

## 2013-10-02 DIAGNOSIS — M79609 Pain in unspecified limb: Secondary | ICD-10-CM

## 2013-10-02 DIAGNOSIS — M65839 Other synovitis and tenosynovitis, unspecified forearm: Secondary | ICD-10-CM

## 2013-10-02 DIAGNOSIS — Z0289 Encounter for other administrative examinations: Secondary | ICD-10-CM

## 2013-10-02 DIAGNOSIS — M65849 Other synovitis and tenosynovitis, unspecified hand: Secondary | ICD-10-CM

## 2013-10-02 DIAGNOSIS — M659 Synovitis and tenosynovitis, unspecified: Secondary | ICD-10-CM

## 2013-10-02 NOTE — Procedures (Signed)
     HISTORY:  Melinda Bradley is a 58 year old patient with a history of right wrist discomfort over the last 2 months. The patient has pain with extension of the right thumb. The patient is being evaluated for possible neuropathy. The patient denies any neck or shoulder discomfort.  NERVE CONDUCTION STUDIES:  Nerve conduction studies were performed on both upper extremities. The distal motor latencies and motor amplitudes for the median and ulnar nerves were within normal limits. The F wave latencies and nerve conduction velocities for these nerves were also normal. The sensory latencies for the median and ulnar nerves were normal.   EMG STUDIES:  EMG study was performed on the right upper extremity:  The first dorsal interosseous muscle reveals 2 to 5 K units with full recruitment. No fibrillations or positive waves were noted. The abductor pollicis brevis muscle reveals 2 to 4 K units with full recruitment. No fibrillations or positive waves were noted. The extensor indicis proprius muscle reveals 1 to 3 K units with full recruitment. No fibrillations or positive waves were noted. The pronator teres muscle reveals 2 to 3 K units with full recruitment. No fibrillations or positive waves were noted. The biceps muscle reveals 1 to 2 K units with full recruitment. No fibrillations or positive waves were noted. The triceps muscle reveals 2 to 4 K units with full recruitment. No fibrillations or positive waves were noted. The anterior deltoid muscle reveals 2 to 3 K units with full recruitment. No fibrillations or positive waves were noted. The cervical paraspinal muscles were tested at 2 levels. No abnormalities of insertional activity were seen at either level tested. There was good relaxation.   IMPRESSION:  Nerve conduction studies done on both upper extremities were within normal limits. No evidence of carpal tunnel syndrome seen. EMG evaluation of the right upper extremity was  unremarkable, without evidence of an overlying cervical radiculopathy.  Marlan Palau. Keith Jailee Jaquez MD 10/02/2013 1:55 PM  [facility] [address] [place], [place] [id number]  Phone [phone] Fax [phone]

## 2013-10-02 NOTE — Progress Notes (Signed)
Iris Perteresa Mitcheltree is a 58 year old patient with a two-month history of pain in the right hand. The patient has had pain with movement across the wrist, and with extension of the right thumb. The patient comes in for EMG and nerve conduction study today.  Nerve conduction studies of both arms were normal, without evidence of carpal tunnel syndrome. EMG of the right arm was normal, without evidence of a cervical radiculopathy.  Clinical examination shows severe pain with extension of the right thumb consistent with tenosynovitis. Blood work will be checked today for a rheumatoid factor, ANA, and sedimentation rate. The patient is to use a splint on the right hand, and take Aleve tablets, 2 tablets twice daily for the next 2 months. If no benefit is noted, a referral to a hand surgeon may be in order.

## 2013-10-02 NOTE — Progress Notes (Signed)
Quick Note:  I called and LMVM for her on mobile # that her EMG/NCS was a normal study. She is to call back if questions. ______

## 2013-10-03 ENCOUNTER — Telehealth: Payer: Self-pay | Admitting: Neurology

## 2013-10-03 LAB — ENA+DNA/DS+SJORGEN'S
ENA SM Ab Ser-aCnc: <0.2 AI (ref 0.0–0.9)
ENA SSA (RO) Ab: <0.2 AI (ref 0.0–0.9)
ENA SSB (LA) AB: 1 AI — AB (ref 0.0–0.9)
dsDNA Ab: <1 IU/mL (ref 0–9)

## 2013-10-03 LAB — RHEUMATOID FACTOR: Rhuematoid fact SerPl-aCnc: 10.5 IU/mL (ref 0.0–13.9)

## 2013-10-03 LAB — SEDIMENTATION RATE: Sed Rate: 2 mm/hr (ref 0–40)

## 2013-10-03 LAB — ANA W/REFLEX: ANA: POSITIVE — AB

## 2013-10-03 NOTE — Telephone Encounter (Signed)
I called the patient. The blood work was unremarkable with exception that the ANA was positive, with a low titer elevation of the SSB antibody. This likely has no clinical significance

## 2013-12-04 ENCOUNTER — Other Ambulatory Visit: Payer: Self-pay | Admitting: Nurse Practitioner

## 2014-03-05 ENCOUNTER — Encounter: Payer: Self-pay | Admitting: *Deleted

## 2014-03-07 ENCOUNTER — Telehealth: Payer: Self-pay | Admitting: *Deleted

## 2014-03-07 NOTE — Telephone Encounter (Signed)
Left patient a message that her appointment scheduled for 03-25-14 needs to be rescheduled.

## 2014-03-11 NOTE — Telephone Encounter (Signed)
Calling patient to r/s appointment, left message to return the call.

## 2014-03-25 ENCOUNTER — Ambulatory Visit: Payer: 59 | Admitting: Nurse Practitioner

## 2014-03-26 ENCOUNTER — Encounter: Payer: Self-pay | Admitting: Nurse Practitioner

## 2014-03-26 ENCOUNTER — Ambulatory Visit (INDEPENDENT_AMBULATORY_CARE_PROVIDER_SITE_OTHER): Payer: 59 | Admitting: Nurse Practitioner

## 2014-03-26 VITALS — BP 130/78 | HR 67 | Ht 67.0 in | Wt 172.0 lb

## 2014-03-26 DIAGNOSIS — G43809 Other migraine, not intractable, without status migrainosus: Secondary | ICD-10-CM

## 2014-03-26 DIAGNOSIS — R42 Dizziness and giddiness: Secondary | ICD-10-CM

## 2014-03-26 MED ORDER — TOPIRAMATE 25 MG PO TABS: 50.0000 mg | ORAL_TABLET | Freq: Two times a day (BID) | ORAL | Status: DC

## 2014-03-26 NOTE — Progress Notes (Signed)
[facility]  PATIENT: Melinda Bradley DOB: 1956/04/03   REASON FOR VISIT: for migraines   HISTORY OF PRESENT ILLNESS:Melinda Bradley, 58 year old female returns for followup. Her migraines are in fairly good control with Topamax 100 mg daily. Imitrex works acutely. She has a history of mitral valve replacement in 2010. She has continued to gain weight even though she claims she exercises and eats healthy. She returns for reevaluation   HISTORY: She was evaluated for "dizzy spells" since 2007-by Dr. Doy Mince on 01/12/10. One severe enough to fall out of chair; went to ED, told had panic attack- went away until 2009, recurred, again negative w/u- found to have murmur at that time, eventually has mitral valve repair May 2011- no further spells from 2009 until June, recurred- had several since- can occur at rest, even in sleep- feel unsteady, sweaty, flushed, then very tired for rest of day and next- room spins counterclockwise, nausea after, no subjective nystagmus, no visual change- lasts about 23mn and resolves- helps to sit still, use cold rags- no ALOC- no assoc HA; long h/o migraine, better w/ hormone- on metoprolol since surg, just stopped Coumadin- She fell out of the chair with another episode 02/25/10. Topamax was increased to 10105mdaily. EEG was done and was normal. She has migraines and doing well on Topamax. 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 No further events with Topamax.  03/19/13: Patient returns for followup she had a severe migraine last week associated with nausea and vomiting. For whatever reason she has decreased her Topamax to 75 mg daily. Imitrex works acutely but typically she has to take an additional tablet and her pills are 50 mg. She has nothing to take for nausea. She is on Wellbutrin for depression and Prozac was  recently added. Her daughter is getting married in 2 weeks and this is stressful for her. She is also going through menopause and has difficulty sleeping    REVIEW OF SYSTEMS: Full 14 system review of systems performed and notable only for those listed, all others are neg:  Constitutional: N/A  Cardiovascular: N/A  Ear/Nose/Throat: N/A  Skin: N/A  Eyes: N/A  Respiratory: N/A   Gastroitestinal: N/A  Hematology/Lymphatic: N/A  Endocrine: N/A Musculoskeletal:N/A  Allergy/Immunology: N/A  Neurological: History of headaches in good control Psychiatric: N/A Sleep : Snoring   ALLERGIES: No Known Allergies  HOME MEDICATIONS: Outpatient Prescriptions Prior to Visit  Medication Sig Dispense Refill  . aspirin 81 MG chewable tablet Chew 81 mg by mouth daily.      . Marland KitchenuPROPion (WELLBUTRIN XL) 300 MG 24 hr tablet Take 300 mg by mouth daily.      . Marland KitchenLUoxetine (PROZAC) 20 MG tablet Take 20 mg by mouth daily.      . metoprolol tartrate (LOPRESSOR) 25 MG tablet Take 1 tablet (25 mg total) by mouth 2 (two) times daily.  30 tablet  11  . SUMAtriptan (IMITREX) 100 MG tablet TAKE 1 TABLET (100 MG TOTAL) BY MOUTH EVERY 2 (TWO) HOURS AS NEEDED FOR MIGRAINE. MAY REPEAT IN 2 HOURS IF HEADACHE PERSISTS OR RECURS.  9 tablet  5  . topiramate (TOPAMAX) 25 MG tablet Take 2 tablets (50 mg total) by mouth 2 (two) times daily.  120 tablet  6   No facility-administered medications prior to visit.    PAST MEDICAL HISTORY: Past Medical History  Diagnosis Date  . Migraine   . Vertigo   . Heart burn   . Symptomatic menopausal or female climacteric states   . Menorrhagia     PAST SURGICAL HISTORY: Past Surgical History  Procedure Laterality Date  . Mitral valve repair  10/2009  . Dilation and curettage of uterus    . Tonsillectomy      FAMILY HISTORY: Family History  Problem Relation Age of Onset  . Headache Father   . Heart Problems Father   . Hiatal hernia Mother     SOCIAL HISTORY: History    Social History  . Marital Status: Married    Spouse Name: [name]    Number of Children: 3  . Years of Education: college   Occupational History  . 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 Smoking status: Former Smoker    Types: Cigarettes    Start date: 09/26/2009  . Smokeless tobacco: Never Used     Comment: Quit 2010  . Alcohol Use: 0.6 oz/week    1 Cans of beer per week     Comment: OCC  . Drug Use: No  . Sexual Activity: Not on file   Other Topics Concern  . Not on file   Social History Narrative   Patient lives at home with her husband. Melinda Bradley)   Patient has 3 children.   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    Patient has a college education.   Caffeine- two cups daily.   Right handed.     PHYSICAL EXAM  Filed Vitals:   03/26/14 1434  BP: 130/78  Pulse: 67  Height: '5\' 7"'  (1.702 m)  Weight: 172 lb (78.019 kg)   Body mass index is 26.93 kg/(m^2). Generalized: Well developed, mildly obese female in no acute distress  Head: normocephalic and atraumatic,. Oropharynx benign  Neck: Supple, no carotid bruits  Cardiac: Regular rate rhythm, no murmur  Musculoskeletal: No deformity  Neurological examination  Mentation: Alert oriented to time, place, history taking. Follows all commands speech and language fluent  Cranial nerve II-XII: Pupils were equal round reactive to light extraocular movements were full, visual field were full on confrontational test. Facial sensation and strength were normal. hearing was intact to finger rubbing bilaterally. Uvula tongue midline. head turning and shoulder shrug were normal and symmetric.Tongue protrusion into cheek strength was normal.  Motor: normal bulk and tone, full strength in the BUE, BLE,. No focal weakness.  Coordination: finger-nose-finger, heel-to-shin bilaterally, no dysmetria  Reflexes: Brachioradialis 2/2, biceps 2/2, triceps 2/2, patellar 2/2, Achilles 2/2, plantar  responses were flexor bilaterally.  Gait and Station: Rising up from seated position without assistance, normal stance, moderate stride, good arm swing, smooth turning, able to perform tiptoe, and heel walking without difficulty. Tandem gait is steady     DIAGNOSTIC DATA (LABS, IMAGING, TESTING) - I reviewed patient records, labs, notes, testing and imaging myself where available.  Lab Results  Component Value Date   WBC 8.0 11/08/2009   HGB 8.4* 11/08/2009   HCT 24.7* 11/08/2009   MCV 87.1 11/08/2009   PLT 183 11/08/2009      Component Value Date/Time   NA 138 11/08/2009 0420   K 3.6 11/08/2009 0420   CL 106 11/08/2009 0420   CO2 28 11/08/2009 0420   GLUCOSE 109* 11/08/2009 0420   BUN 6 11/08/2009 0420   CREATININE 0.77 11/08/2009 0420   CALCIUM 8.2* 11/08/2009 0420   PROT 6.0  10/31/2009 1035   ALBUMIN 3.8 10/31/2009 1035   AST 17 10/31/2009 1035   ALT 17 10/31/2009 1035   ALKPHOS 58 10/31/2009 1035   BILITOT 0.3 10/31/2009 1035   GFRNONAA >60 11/08/2009 0420   GFRAA  Value: >60        The eGFR has been calculated using the MDRD equation. This calculation has not been validated in all clinical situations. eGFR's persistently <60 mL/min signify possible Chronic Kidney Disease. 11/08/2009 0420    Lab Results  Component Value Date   HGBA1C  Value: 5.6 (NOTE)                                                                       According to the ADA Clinical Practice Recommendations for 2011, when HbA1c is used as a screening test:   >=6.5%   Diagnostic of Diabetes Mellitus           (if abnormal result  is confirmed)  5.7-6.4%   Increased risk of developing Diabetes Mellitus  References:Diagnosis and Classification of Diabetes Mellitus,Diabetes Care,2011,34(Suppl 1):S62-S69 and Standards of Medical Care in         Diabetes - 2011,Diabetes QCRS,3220,19  (Suppl 1):S11-S61. 10/31/2009      ASSESSMENT AND PLAN  58 y.o. year old female  has a past medical history of Migraine; Vertigo;  and Menorrhagia. here  to followup. At the end of the visit she mentions memory loss short-term however she have another doctor's appointment in 15 minutes and cannot stay for a Mini-Mental Status exam. I made her aware it could be the Topamax.   Continue Topamax at current dose will refill Continue Imitrex will refill Followup in 6-8 months(she may call earlier to be evaluated for memory) Dennie Bible, [facility], [facility], APRN  [facility] [address], [place], [place] [address] [phone] Will be for a week these vasovagal smaller on the today to reevaluate her pain she

## 2014-03-26 NOTE — Patient Instructions (Signed)
Continue Topamax at current dose will refill Continue Imitrex will refill Followup in 6-8 months

## 2014-03-26 NOTE — Progress Notes (Signed)
I have read the note, and I agree with the clinical assessment and plan.  WILLIS,CHARLES KEITH   

## 2014-08-13 ENCOUNTER — Telehealth: Payer: Self-pay

## 2014-08-13 NOTE — Telephone Encounter (Signed)
Called patient left her message stating please call the office and reschedule her apt. With Meachamarolyn on 09-24-14 at 2:00. CM/Willis. Please reschedule apt when patient class. Thanks Annabelle Harmanana.

## 2014-09-14 ENCOUNTER — Other Ambulatory Visit: Payer: Self-pay | Admitting: Interventional Cardiology

## 2014-09-24 ENCOUNTER — Encounter: Payer: Self-pay | Admitting: Interventional Cardiology

## 2014-09-24 ENCOUNTER — Ambulatory Visit (INDEPENDENT_AMBULATORY_CARE_PROVIDER_SITE_OTHER): Payer: 59 | Admitting: Interventional Cardiology

## 2014-09-24 ENCOUNTER — Ambulatory Visit: Payer: 59 | Admitting: Nurse Practitioner

## 2014-09-24 VITALS — BP 122/80 | HR 64 | Ht 67.0 in | Wt 181.0 lb

## 2014-09-24 DIAGNOSIS — G4730 Sleep apnea, unspecified: Secondary | ICD-10-CM | POA: Insufficient documentation

## 2014-09-24 DIAGNOSIS — I059 Rheumatic mitral valve disease, unspecified: Secondary | ICD-10-CM | POA: Diagnosis not present

## 2014-09-24 DIAGNOSIS — R0681 Apnea, not elsewhere classified: Secondary | ICD-10-CM

## 2014-09-24 DIAGNOSIS — R635 Abnormal weight gain: Secondary | ICD-10-CM

## 2014-09-24 DIAGNOSIS — R0602 Shortness of breath: Secondary | ICD-10-CM | POA: Diagnosis not present

## 2014-09-24 DIAGNOSIS — R0600 Dyspnea, unspecified: Secondary | ICD-10-CM | POA: Insufficient documentation

## 2014-09-24 DIAGNOSIS — R0609 Other forms of dyspnea: Secondary | ICD-10-CM

## 2014-09-24 DIAGNOSIS — E663 Overweight: Secondary | ICD-10-CM | POA: Insufficient documentation

## 2014-09-24 LAB — BASIC METABOLIC PANEL
BUN: 13 mg/dL (ref 6–23)
CHLORIDE: 107 meq/L (ref 96–112)
CO2: 27 meq/L (ref 19–32)
CREATININE: 0.98 mg/dL (ref 0.40–1.20)
Calcium: 9.7 mg/dL (ref 8.4–10.5)
GFR: 62.04 mL/min (ref >60.00)
Glucose, Bld: 84 mg/dL (ref 70–99)
Potassium: 4.1 mEq/L (ref 3.5–5.1)
Sodium: 138 mEq/L (ref 135–145)

## 2014-09-24 LAB — BRAIN NATRIURETIC PEPTIDE: Pro B Natriuretic peptide (BNP): 111 pg/mL — ABNORMAL HIGH (ref 0.0–100.0)

## 2014-09-24 NOTE — Progress Notes (Signed)
Patient ID: Melinda Bradley, female   DOB: 09-Sep-1955, 59 y.o.   MRN: [id number] Patient ID: Melinda Bradley, female   DOB: 07-14-1955, 59 y.o.   MRN: [id number]    [address] [place], [place]  [address] Phone: [phone] Fax:  [phone]  Date:  09/24/2014   ID:  Melinda, Bradley February 01, 1956, MRN [id number]  PCP:  Neldon Labella, MD      History of Present Illness: Melinda Bradley is a 59 y.o. female who had a mitral valve repair in 2011. She has been as active. SHe has gained more weight. DOE has been persistent; now notes wheezing.   She was started on Well butrin after her surgery. SHe was also having dizziness related to migraines. Improved with meds.  She is exercising regularly. SHe has not lost any weight. BP at home has been in the 140/92 range at home.  Her job is stressful.  She is working 12-14 hour days.  She is sedentary.  She has had two episodes of back pain where she has to stand and then feels pain with a deep breath.    She feels thirsty at times at night.  She has been drinking Propel.  She has been told she now snores severely.  She does sometimes wake up choking.    She feels fatigue in the morning.    She is worried about how to lose weight.    Wt Readings from Last 3 Encounters:  09/24/14 181 lb (82.101 kg)  03/26/14 172 lb (78.019 kg)  09/19/13 178 lb (80.74 kg)     Past Medical History  Diagnosis Date  . Migraine   . Vertigo   . Heart burn   . Symptomatic menopausal or female climacteric states   . Menorrhagia     Current Outpatient Prescriptions  Medication Sig Dispense Refill  . aspirin 81 MG tablet Take 81 mg by mouth daily.    Marland Kitchen buPROPion (WELLBUTRIN XL) 300 MG 24 hr tablet Take 300 mg by mouth daily.    Marland Kitchen FLUoxetine (PROZAC) 20 MG tablet Take 20 mg by mouth daily.    . metoprolol tartrate (LOPRESSOR) 25 MG tablet TAKE 1 TABLET (25 MG TOTAL) BY MOUTH DAILY. 30 tablet 0  . SUMAtriptan (IMITREX) 100 MG tablet TAKE  1 TABLET (100 MG TOTAL) BY MOUTH EVERY 2 (TWO) HOURS AS NEEDED FOR MIGRAINE. MAY REPEAT IN 2 HOURS IF HEADACHE PERSISTS OR RECURS. 9 tablet 5  . topiramate (TOPAMAX) 25 MG tablet Take 2 tablets (50 mg total) by mouth 2 (two) times daily. 120 tablet 6   No current facility-administered medications for this visit.    Allergies:   No Known Allergies  Social History:  The patient  reports that she has quit smoking. Her smoking use included Cigarettes. She started smoking about 4 years ago. She has never used smokeless tobacco. She reports that she drinks about 0.6 oz of alcohol per week. She reports that she does not use illicit drugs.   Family History:  The patient's family history includes Headache in her father; Heart Problems in her father; Hiatal hernia in her mother.   ROS:  Please see the history of present illness.  No nausea, vomiting.  No fevers, chills.  No focal weakness.  No dysuria.    All other systems reviewed and negative. Sx of menopause/  Weight gain  PHYSICAL EXAM: VS:  BP 122/80 mmHg  Pulse 64  Ht  (1.702  m)  Wt 181 lb (82.101 kg)  BMI 28.34 kg/m2 Well nourished, well developed, in no acute distress HEENT: normal Neck: no JVD, no carotid bruits Cardiac:  normal S1, S2; RRR;  Lungs:  clear to auscultation bilaterally, no wheezing, rhonchi or rales Abd: soft, nontender, no hepatomegaly Ext: no edema Skin: warm and dry Neuro:   no focal abnormalities noted Psych: anxious affect  EKG:  NSR, lateral ST segment changes  ASSESSMENT AND PLAN:  Shortness of breath on exertion /obesity She has tried to exercise more and lose some weight to try to improve stamina.  She has continued to gain weight.  SHe walks the treadmill several times a week.  Refer to nutriotionist.  Check echo, BMet and BNP.  2. Elevated blood pressure reading without diagnosis of hypertension  Check BP at home. Readings should be below 140/90. If readings consistently above this, let us know. BP  controlled today.   Higher readings at home may be related to weight gain. 3. Mitral valve disorders  s/p MV repair. SHe takes SBE prophylaxis.   Check echo since it has been 5 years since the surgery.   She would like to try the cardiac rehabilitation program. Will refer to maintenance phase.  4.   Observed apnea : Check sleep study. She has been told she snores very loudly. She's had issues with fatigue and with waking up gasping for breath.   Signed, Fredric MareJay S. Maxxon Schwanke, MD, [facility] 09/24/2014 10:56 AM

## 2014-09-24 NOTE — Patient Instructions (Signed)
**Note De-Identified Daleyssa Loiselle Obfuscation** Your physician recommends that you continue on your current medications as directed. Please refer to the Current Medication list given to you today.  Your physician has requested that you have an echocardiogram. Echocardiography is a painless test that uses sound waves to create images of your heart. It provides your doctor with information about the size and shape of your heart and how well your heart's chambers and valves are working. This procedure takes approximately one hour. There are no restrictions for this procedure.  Your physician recommends that you return for lab work in: today (BNP and BMET)  Dr Nechama GuardVarinasi is referring you to a nutritionalist and cardiac rehab, will will arrange.  Your physician has recommended that you have a sleep study. This test records several body functions during sleep, including: brain activity, eye movement, oxygen and carbon dioxide blood levels, heart rate and rhythm, breathing rate and rhythm, the flow of air through your mouth and nose, snoring, body muscle movements, and chest and belly movement.  Your physician wants you to follow-up in: 6 months. You will receive a reminder letter in the mail two months in advance. If you don't receive a letter, please call our office to schedule the follow-up appointment.

## 2014-09-27 ENCOUNTER — Ambulatory Visit (HOSPITAL_COMMUNITY): Payer: 59 | Attending: Family Medicine | Admitting: Radiology

## 2014-09-27 ENCOUNTER — Encounter: Payer: 59 | Attending: Interventional Cardiology | Admitting: Skilled Nursing Facility1

## 2014-09-27 ENCOUNTER — Encounter: Payer: Self-pay | Admitting: Skilled Nursing Facility1

## 2014-09-27 VITALS — Ht 67.0 in | Wt 183.0 lb

## 2014-09-27 DIAGNOSIS — Z713 Dietary counseling and surveillance: Secondary | ICD-10-CM | POA: Insufficient documentation

## 2014-09-27 DIAGNOSIS — I059 Rheumatic mitral valve disease, unspecified: Secondary | ICD-10-CM | POA: Diagnosis not present

## 2014-09-27 DIAGNOSIS — Z6828 Body mass index (BMI) 28.0-28.9, adult: Secondary | ICD-10-CM | POA: Diagnosis not present

## 2014-09-27 DIAGNOSIS — E663 Overweight: Secondary | ICD-10-CM | POA: Diagnosis not present

## 2014-09-27 NOTE — Progress Notes (Signed)
  Medical Nutrition Therapy:  Appt start time: 1000 end time:  1100.   Assessment:  Primary concerns today: Referred for weight gain. Pt states she has Migraines silent (without pain and with vertigo) and painful migraines; heart surgery in 2010. Pt states she Gained 43 pounds since 2010 and then started menopause at that time as well. Pt states she has shortness of breath in the last year.Pt states she will be Doing a sleep study June first for choking at night. Pt states she has a High stress job-12-14 hours a day. Pt states her Energy level is good. Pt states she has Never dieted.  Preferred Learning Style:   Auditory  Learning Readiness:   Ready   MEDICATIONS: See List   DIETARY INTAKE:  Usual eating pattern includes 2 meals and 2 snacks per day.  Everyday foods include none stated.  Avoided foods include bread.    24-hr recall:  B ( AM): none Snk ( AM):  chips, almonds, pretzels, melba toast, pork rins, chex mix L ( PM): salads with beans from cafeteria Snk ( PM): chips, almonds, pretzels, melba toast D ( PM): steak and broccoli Snk ( PM): none Beverages: diet Dr. Reino KentPepper, propel, coffee  *Noodles and company 3-4 times a week  Usual physical activity: walk 40 minutes with an incline 4 times a week  Estimated energy needs: 1600 calories 180 g carbohydrates 120 g protein 44 g fat  Progress Towards Goal(s):  In progress.   Nutritional Diagnosis:  Harvey-3.3 Overweight/obesity As related to calorically dense snacking.  As evidenced by pt report and BMI 28.66 .    Intervention:  Nutrition counseling for weight gain. Dietitian educated the pt on the importance of three meals a day and 2-3 snacks, being physically active every day, and deep breathing.  Goals: -Try to set an alarm for every two hours to take a standing break -Try to be physically active every day -Try to eat 3 meals a day and 2-3 snacks -Try to make half the plate non-starchy vegetables -Try yoga and  working on your breathing   Teaching Method Utilized: Scientific laboratory technicianVisual Auditory  Handouts given during visit include:  Low sodium seasonings  Barriers to learning/adherence to lifestyle change: stressful/time consuming job  Demonstrated degree of understanding via:  Teach Back   Monitoring/Evaluation:  Dietary intake, exercise, stress relief mechanisms, and body weight prn.

## 2014-09-27 NOTE — Patient Instructions (Signed)
-  Try to set an alarm for every two hours to take a standing break -Try to be physically active every day -Try to eat 3 meals a day and 2-3 snacks -Try to make half the plate non-starchy vegetables -Try yoga and working on your breathing

## 2014-09-27 NOTE — Progress Notes (Signed)
Echocardiogram performed.  

## 2014-09-30 ENCOUNTER — Telehealth: Payer: Self-pay | Admitting: Interventional Cardiology

## 2014-09-30 NOTE — Telephone Encounter (Signed)
Follow Up        Pt returning Jennifer's phone call for echo results.

## 2014-09-30 NOTE — Telephone Encounter (Signed)
Informed pt of echo results. Pt verbalized understanding. 

## 2014-11-01 ENCOUNTER — Other Ambulatory Visit: Payer: Self-pay

## 2014-11-01 MED ORDER — SUMATRIPTAN SUCCINATE 100 MG PO TABS: 100.0000 mg | ORAL_TABLET | ORAL | Status: DC | PRN

## 2014-11-18 ENCOUNTER — Other Ambulatory Visit: Payer: Self-pay | Admitting: *Deleted

## 2014-11-18 DIAGNOSIS — G4730 Sleep apnea, unspecified: Secondary | ICD-10-CM

## 2014-11-18 DIAGNOSIS — R0683 Snoring: Secondary | ICD-10-CM

## 2014-11-27 ENCOUNTER — Telehealth: Payer: Self-pay | Admitting: Interventional Cardiology

## 2014-11-27 ENCOUNTER — Encounter (HOSPITAL_BASED_OUTPATIENT_CLINIC_OR_DEPARTMENT_OTHER): Payer: 59

## 2014-11-27 NOTE — Telephone Encounter (Signed)
Spoke with pt and she states that she had received a call to remind her about her split night study scheduled for tonight and then the next day received a letter in the mail from her insurance stating that they had denied the split night but could have a home study. Informed pt that Jasmine DecemberSharon from our [facility] team had contacted the sleep study dept and they working on getting that arranged. Pt stated that she would call and cancel the split night and await a call about home study. Sp[facility] with [facility]Sh[facility] and she had spoke with sleep study facility and they stated that they had left a message on 5/26 for pt to call and schedule home sleep study. Called pt and informed her of this information. Pt states that she will call to set this up. Informed pt to let me know if she has any issues.

## 2014-11-27 NOTE — Telephone Encounter (Signed)
Left message to call back  

## 2014-11-27 NOTE — Telephone Encounter (Signed)
New problem   Pt has sleep study scheduled for tonight, and received a letter in the mail yesterday that her insurance wouldn't pay to have it done, but would pay for an in home study. Pt need to speak to nurse to be advised what she need to do. Please call pt.

## 2014-12-09 ENCOUNTER — Other Ambulatory Visit: Payer: Self-pay | Admitting: Nurse Practitioner

## 2014-12-16 ENCOUNTER — Telehealth (HOSPITAL_COMMUNITY): Payer: Self-pay | Admitting: *Deleted

## 2014-12-23 ENCOUNTER — Ambulatory Visit (HOSPITAL_BASED_OUTPATIENT_CLINIC_OR_DEPARTMENT_OTHER): Payer: 59 | Attending: Interventional Cardiology | Admitting: Radiology

## 2014-12-23 DIAGNOSIS — G4719 Other hypersomnia: Secondary | ICD-10-CM

## 2014-12-23 DIAGNOSIS — G4733 Obstructive sleep apnea (adult) (pediatric): Secondary | ICD-10-CM | POA: Insufficient documentation

## 2014-12-23 DIAGNOSIS — R0683 Snoring: Secondary | ICD-10-CM | POA: Diagnosis not present

## 2015-01-09 DIAGNOSIS — G4719 Other hypersomnia: Secondary | ICD-10-CM | POA: Diagnosis not present

## 2015-01-09 DIAGNOSIS — G4733 Obstructive sleep apnea (adult) (pediatric): Secondary | ICD-10-CM | POA: Diagnosis not present

## 2015-01-09 NOTE — Progress Notes (Signed)
    NAME: Melinda Bradley DATE OF BIRTH:  1956/03/29 MEDICAL RECORD NUMBER [id number]  LOCATION: [facility]  PHYSICIAN: Haneef Hallquist D  DATE OF STUDY: 12/23/2014  SLEEP STUDY TYPE: Out of Center Sleep Test- unattended                REFERRING PHYSICIAN: Corky CraftsVaranasi, Jayadeep S, MD  INDICATION FOR STUDY: Obstructive sleep apnea  EPWORTH SLEEPINESS SCORE:  9/24 HEIGHT:   5\' 6"  WEIGHT:   176 lbs   BMI 28 NECK SIZE:   in.  MEDICATIONS: Charted for review  IMPRESSION:  Mild obstructive sleep apnea/ hypopnea syndrome, AHI 8.0/ hr. Snoring with oxygen desaturation to a nadir of 85% and average oxygen saturation 93% on room air. Mean heart rate 69.5/ min.    RECOMMENDATION:  Weight loss, encouragement to sleep off flat of back, and treatment for nasal congestion may be appropriate and sufficient. On an individual basis, an oral appliance or CPAP may be appropriate.  Waymon BudgeYOUNG,Egbert Seidel D Diplomate, American Board of Sleep Medicine  ELECTRONICALLY SIGNED ON:  01/09/2015, 2:29 PM [facility] PH: [phone]   FX: [phone] ACCREDITED BY THE AMERICAN ACADEMY OF SLEEP MEDICINE

## 2015-02-03 ENCOUNTER — Encounter (HOSPITAL_COMMUNITY)
Admission: RE | Admit: 2015-02-03 | Discharge: 2015-02-03 | Disposition: A | Discharge status: Home / Self Care | Payer: Self-pay | Source: Ambulatory Visit | Attending: Interventional Cardiology | Admitting: Interventional Cardiology

## 2015-02-03 DIAGNOSIS — Z952 Presence of prosthetic heart valve: Secondary | ICD-10-CM | POA: Insufficient documentation

## 2015-02-03 DIAGNOSIS — Z48812 Encounter for surgical aftercare following surgery on the circulatory system: Secondary | ICD-10-CM | POA: Insufficient documentation

## 2015-02-05 ENCOUNTER — Encounter (HOSPITAL_COMMUNITY)
Admission: RE | Admit: 2015-02-05 | Discharge: 2015-02-05 | Disposition: A | Discharge status: Home / Self Care | Payer: Self-pay | Source: Ambulatory Visit | Attending: Interventional Cardiology | Admitting: Interventional Cardiology

## 2015-02-07 ENCOUNTER — Encounter (HOSPITAL_COMMUNITY): Payer: Self-pay

## 2015-02-10 ENCOUNTER — Encounter (HOSPITAL_COMMUNITY)
Admission: RE | Admit: 2015-02-10 | Discharge: 2015-02-10 | Disposition: A | Discharge status: Home / Self Care | Payer: Self-pay | Source: Ambulatory Visit | Attending: Interventional Cardiology | Admitting: Interventional Cardiology

## 2015-02-12 ENCOUNTER — Encounter (HOSPITAL_COMMUNITY): Payer: Self-pay

## 2015-02-14 ENCOUNTER — Encounter (HOSPITAL_COMMUNITY): Payer: Self-pay

## 2015-02-17 ENCOUNTER — Encounter (HOSPITAL_COMMUNITY): Payer: Self-pay

## 2015-02-19 ENCOUNTER — Encounter (HOSPITAL_COMMUNITY): Payer: Self-pay

## 2015-02-21 ENCOUNTER — Encounter (HOSPITAL_COMMUNITY): Payer: Self-pay

## 2015-02-24 ENCOUNTER — Encounter (HOSPITAL_COMMUNITY)
Admission: RE | Admit: 2015-02-24 | Discharge: 2015-02-24 | Disposition: A | Discharge status: Home / Self Care | Payer: Self-pay | Source: Ambulatory Visit | Attending: Interventional Cardiology | Admitting: Interventional Cardiology

## 2015-02-26 ENCOUNTER — Encounter (HOSPITAL_COMMUNITY)
Admission: RE | Admit: 2015-02-26 | Discharge: 2015-02-26 | Disposition: A | Discharge status: Home / Self Care | Payer: Self-pay | Source: Ambulatory Visit | Attending: Interventional Cardiology | Admitting: Interventional Cardiology

## 2015-02-26 NOTE — Progress Notes (Signed)
Reviewed home exercise guidelines with patient including endpoints, temperature precautions, target heart rate and rate of perceived exertion. Pt plans to walk, bike, and belongs to a fitness as her mode of home exercise. Pt voices understanding of instructions given. Artist Pais, MS, ACSM CCEP

## 2015-02-28 ENCOUNTER — Encounter (HOSPITAL_COMMUNITY): Payer: 59 | Attending: Interventional Cardiology

## 2015-02-28 DIAGNOSIS — Z48812 Encounter for surgical aftercare following surgery on the circulatory system: Secondary | ICD-10-CM | POA: Insufficient documentation

## 2015-02-28 DIAGNOSIS — Z952 Presence of prosthetic heart valve: Secondary | ICD-10-CM | POA: Insufficient documentation

## 2015-03-03 ENCOUNTER — Encounter (HOSPITAL_COMMUNITY): Payer: Self-pay

## 2015-03-05 ENCOUNTER — Encounter (HOSPITAL_COMMUNITY): Payer: Self-pay

## 2015-03-07 ENCOUNTER — Encounter (HOSPITAL_COMMUNITY): Payer: Self-pay

## 2015-03-10 ENCOUNTER — Encounter (HOSPITAL_COMMUNITY): Payer: Self-pay

## 2015-03-12 ENCOUNTER — Encounter (HOSPITAL_COMMUNITY): Payer: Self-pay

## 2015-03-14 ENCOUNTER — Encounter (HOSPITAL_COMMUNITY): Payer: Self-pay

## 2015-03-17 ENCOUNTER — Encounter (HOSPITAL_COMMUNITY): Payer: Self-pay

## 2015-03-19 ENCOUNTER — Encounter (HOSPITAL_COMMUNITY): Payer: Self-pay

## 2015-03-21 ENCOUNTER — Encounter (HOSPITAL_COMMUNITY): Payer: Self-pay

## 2015-03-24 ENCOUNTER — Encounter (HOSPITAL_COMMUNITY): Payer: Self-pay

## 2015-03-26 ENCOUNTER — Telehealth (HOSPITAL_COMMUNITY): Payer: Self-pay | Admitting: *Deleted

## 2015-03-26 ENCOUNTER — Encounter (HOSPITAL_COMMUNITY): Admission: RE | Admit: 2015-03-26 | Payer: Self-pay | Source: Ambulatory Visit

## 2015-03-28 ENCOUNTER — Encounter (HOSPITAL_COMMUNITY): Payer: Self-pay

## 2015-03-31 ENCOUNTER — Encounter (HOSPITAL_COMMUNITY): Payer: Self-pay

## 2015-04-02 ENCOUNTER — Encounter (HOSPITAL_COMMUNITY): Payer: Self-pay

## 2015-04-04 ENCOUNTER — Encounter (HOSPITAL_COMMUNITY): Payer: Self-pay

## 2015-04-07 ENCOUNTER — Encounter (HOSPITAL_COMMUNITY): Payer: Self-pay

## 2015-05-01 ENCOUNTER — Other Ambulatory Visit: Payer: Self-pay | Admitting: Interventional Cardiology

## 2015-05-29 ENCOUNTER — Other Ambulatory Visit: Payer: Self-pay | Admitting: Nurse Practitioner

## 2015-06-01 ENCOUNTER — Other Ambulatory Visit: Payer: Self-pay

## 2015-09-27 ENCOUNTER — Other Ambulatory Visit: Payer: Self-pay | Admitting: Nurse Practitioner

## 2015-10-13 ENCOUNTER — Telehealth: Payer: Self-pay | Admitting: *Deleted

## 2015-10-13 NOTE — Telephone Encounter (Signed)
I LMVM for pt to return call to set up appt for refill on her sumatriptan (imitrex).  Pt last seen in 2015.

## 2015-10-14 NOTE — Telephone Encounter (Signed)
Spoke to pt and made appt for tomorrow at 0800.  Will get refill for imitrex then.

## 2015-10-14 NOTE — Telephone Encounter (Signed)
[facility] to let [facility] know that I LMVM for pt to return call about scheduling appt prior to refill of migraine med.

## 2015-10-15 ENCOUNTER — Ambulatory Visit (INDEPENDENT_AMBULATORY_CARE_PROVIDER_SITE_OTHER): Payer: BLUE CROSS/BLUE SHIELD | Admitting: Nurse Practitioner

## 2015-10-15 ENCOUNTER — Encounter: Payer: Self-pay | Admitting: Nurse Practitioner

## 2015-10-15 VITALS — BP 125/82 | HR 65 | Ht 67.0 in | Wt 179.2 lb

## 2015-10-15 DIAGNOSIS — G43809 Other migraine, not intractable, without status migrainosus: Secondary | ICD-10-CM

## 2015-10-15 MED ORDER — SUMATRIPTAN SUCCINATE 100 MG PO TABS
ORAL_TABLET | ORAL | Status: DC
Start: 2015-10-15 — End: 2016-10-13

## 2015-10-15 MED ORDER — TOPIRAMATE 25 MG PO TABS
50.0000 mg | ORAL_TABLET | Freq: Two times a day (BID) | ORAL | Status: AC
Start: 2015-10-15 — End: ?

## 2015-10-15 NOTE — Patient Instructions (Signed)
Continue Topamax at current dose will refill Continue Imitrex at current dose, will refill Call for increase in headaches F/U in 1 year

## 2015-10-15 NOTE — Progress Notes (Signed)
[facility]  PATIENT: Melinda Bradley DOB: 1955/08/15   REASON FOR VISIT: follow up for migraines HISTORY FROM:Patient    HISTORY OF PRESENT ILLNESS:Ms. Melinda Bradley, 60 year old female returns for followup. She was last seen in the office 03/22/2014. Her migraines are in  good control with Topamax 100 mg daily. Imitrex works acutely. She has a history of mitral valve replacement in 2010.  She returns for reevaluation . She needs refills on her medications  HISTORY: She was evaluated for "dizzy spells" since 2007-by Dr. Thad Ranger on 01/12/10. One severe enough to fall out of chair; went to ED, told had panic attack- went away until 2009, recurred, again negative w/u- found to have murmur at that time, eventually has mitral valve repair May 2011- no further spells from 2009 until June, recurred- had several since- can occur at rest, even in sleep- feel unsteady, sweaty, flushed, then very tired for rest of day and next- room spins counterclockwise, nausea after, no subjective nystagmus, no visual change- lasts about and resolves- helps to sit still, use cold rags- no ALOC- no assoc HA; long h/o migraine, better w/ hormone- on metoprolol since surg, just stopped Coumadin- She fell out of the chair with another episode 02/25/10. Topamax was increased to  daily. EEG was done and was normal. She has migraines and doing well on Topamax. 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 CT angiogram normal in the past. No further events with Topamax.  03/19/13: Patient returns for followup she had a severe migraine last week associated with nausea and vomiting. For whatever reason she has decreased her Topamax to 75 mg daily. Imitrex works acutely but typically she has to take an additional tablet and her pills are 50 mg. She has nothing to take for nausea. She is on Wellbutrin for  depression and Prozac was recently added. Her daughter is getting married in 2 weeks and this is stressful for her. She is also going through menopause and has difficulty sleeping    REVIEW OF SYSTEMS: Full 14 system review of systems performed and notable only for those listed, all others are neg:  Constitutional: neg  Cardiovascular: neg Ear/Nose/Throat: neg  Skin: neg Eyes: neg Respiratory: neg Gastroitestinal: neg  Hematology/Lymphatic: neg  Endocrine: neg Musculoskeletal:neg Allergy/Immunology: neg Neurological: Migraine headaches Psychiatric: neg Sleep : neg   ALLERGIES: No Known Allergies  HOME MEDICATIONS: Outpatient Prescriptions Prior to Visit  Medication Sig Dispense Refill  . aspirin 81 MG tablet Take 81 mg by mouth daily.    Marland Kitchen buPROPion (WELLBUTRIN XL) 300 MG 24 hr tablet Take 300 mg by mouth daily.    Marland Kitchen FLUoxetine (PROZAC) 20 MG tablet Take 20 mg by mouth daily.    . metoprolol tartrate (LOPRESSOR) 25 MG tablet TAKE 1 TABLET (25 MG TOTAL) BY MOUTH DAILY. 30 tablet 4  . SUMAtriptan (IMITREX) 100 MG tablet TAKE 1 TABLET (100 MG TOTAL) BY MOUTH AS NEEDED FOR MIGRAINE. MAY REPEAT IN 2 HOURS IF HEADACHE PERSISTS OR RECURS. 9 tablet 0  . topiramate (TOPAMAX) 25 MG tablet Take 2 tablets (50 mg total) by mouth 2 (two) times daily. 120 tablet 6   No facility-administered medications prior to visit.    PAST MEDICAL HISTORY: Past Medical History  Diagnosis Date  . Migraine   . Vertigo   . Heart burn   . Symptomatic menopausal or female climacteric states   .  Menorrhagia     PAST SURGICAL HISTORY: Past Surgical History  Procedure Laterality Date  . Mitral valve repair  10/2009  . Dilation and curettage of uterus    . Tonsillectomy      FAMILY HISTORY: Family History  Problem Relation Age of Onset  . Headache Father   . Heart Problems Father   . Hiatal hernia Mother     SOCIAL HISTORY: Social History   Social History  . Marital Status: Married     Spouse Name: Melinda RuizJohn  . Number of Children: 3  . Years of Education: college   Occupational History  . ASSISTANT DIRECTOR ITS [employer]   Social History Main Topics  . Smoking status: Former Smoker    Types: Cigarettes    Start date: 09/26/2009  . Smokeless tobacco: Never Used     Comment: Quit 2010  . Alcohol Use: 0.6 oz/week    1 Cans of beer per week     Comment: OCC  . Drug Use: No  . Sexual Activity: Not on file   Other Topics Concern  . Not on file   Social History Narrative   Patient lives at home with her husband. Melinda Bradley(Melinda Bradley)   Patient has 3 children.   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    Patient has a college education.   Caffeine- two cups daily.   Right handed.     PHYSICAL EXAM  Filed Vitals:   10/15/15 0802  BP: 125/82  Pulse: 65  Height: 5\' 7"  (1.702 m)  Weight: 179 lb 3.2 oz (81.285 kg)   Body mass index is 28.06 kg/(m^2). Generalized: Well developed, mildly obese female in no acute distress  Head: normocephalic and atraumatic,. Oropharynx benign  Neck: Supple, no carotid bruits  Cardiac: Regular rate rhythm, no murmur  Musculoskeletal: No deformity  Neurological examination  Mentation: Alert oriented to time, place, history taking. Follows all commands speech and language fluent  Cranial nerve II-XII: Pupils were equal round reactive to light extraocular movements were full, visual field were full on confrontational test. Facial sensation and strength were normal. hearing was intact to finger rubbing bilaterally. Uvula tongue midline. head turning and shoulder shrug were normal and symmetric.Tongue protrusion into cheek strength was normal.  Motor: normal bulk and tone, full strength in the BUE, BLE,. No focal weakness.  Coordination: finger-nose-finger, heel-to-shin bilaterally, no dysmetria  Reflexes: Brachioradialis 2/2, biceps 2/2, triceps 2/2, patellar 2/2, Achilles 2/2, plantar responses were flexor  bilaterally.  Gait and Station: Rising up from seated position without assistance, normal stance, moderate stride, good arm swing, smooth turning, able to perform tiptoe, and heel walking without difficulty. Tandem gait is steady  DIAGNOSTIC DATA (LABS, IMAGING, TESTING) - ASSESSMENT AND PLAN  60 y.o. year old female  has a past medical history of Migraine; Vertigo;  here to follow-up for her migraines which are in good control on Topamax and Imitrex acutely.  Continue Topamax at current dose will refill Continue Imitrex at current dose, will refill Call for increase in headaches F/U in 1 year Nilda RiggsNancy Carolyn Lus Kriegel, [facility] CenterGNP, [facility]BC, APRN  [facility] [address], [address] [place], [place] [id number] [phone]

## 2015-10-22 DIAGNOSIS — J209 Acute bronchitis, unspecified: Secondary | ICD-10-CM | POA: Diagnosis not present

## 2015-10-22 DIAGNOSIS — J069 Acute upper respiratory infection, unspecified: Secondary | ICD-10-CM | POA: Diagnosis not present

## 2015-12-22 ENCOUNTER — Telehealth: Payer: Self-pay | Admitting: Cardiology

## 2015-12-22 ENCOUNTER — Encounter: Payer: Self-pay | Admitting: Cardiology

## 2015-12-22 ENCOUNTER — Ambulatory Visit (INDEPENDENT_AMBULATORY_CARE_PROVIDER_SITE_OTHER): Payer: BLUE CROSS/BLUE SHIELD | Admitting: Cardiology

## 2015-12-22 VITALS — BP 140/100 | HR 91 | Ht 66.5 in | Wt 178.1 lb

## 2015-12-22 DIAGNOSIS — I059 Rheumatic mitral valve disease, unspecified: Secondary | ICD-10-CM

## 2015-12-22 MED ORDER — METOPROLOL TARTRATE 25 MG PO TABS: 25.0000 mg | ORAL_TABLET | Freq: Two times a day (BID) | ORAL | Status: DC

## 2015-12-22 MED ORDER — METOPROLOL TARTRATE 25 MG PO TABS: 12.5000 mg | ORAL_TABLET | Freq: Two times a day (BID) | ORAL | Status: DC

## 2015-12-22 NOTE — Telephone Encounter (Signed)
Will forward to AT&TBrittainy Simmons since patient has had Mitral valve repair.

## 2015-12-22 NOTE — Patient Instructions (Addendum)
Medication Instructions:   START TAKING TOPROL  25 MG TWICE A DAY   If you need a refill on your cardiac medications before your next appointment, please call your pharmacy.  Labwork: NONE ORDER TODAY    Testing/Procedures: NONE ORDER TODAY    Follow-Up: Your physician wants you to follow-up in: ONE YEAR WITH VARANASI You will receive a reminder letter in the mail two months in advance. If you don't receive a letter, please call our office to schedule the follow-up appointment.     Any Other Special Instructions Will Be Listed Below (If Applicable).

## 2015-12-22 NOTE — Telephone Encounter (Signed)
New Message:   She was just here to see Melinda Bradley,forgot to ask her a question. She is going to the dentist on the 28th,does she need an antibiotic before she goes?

## 2015-12-22 NOTE — Progress Notes (Signed)
12/22/2015 Melinda Bradley   [birth year]  [id number]  Primary Physician Neldon LabellaMILLER,LISA LYNN, MD Primary Cardiologist: Dr. Eldridge Bradley    Reason for Visit/CC: Routine f/u for MVP and HTN  HPI:  60 y/o female, followed by Dr. Eldridge Bradley, with a h/o MV disease s/p mitral valve repair in 2011. Her last 2D echo was 1 year ago 09/2014. EF was normal at 60-65%. MR functioning well. Only mild MS and trace MR noted. She has also had mildly elevated BPs. She is on metoprolol. She has also been told that she snores loudly and observed to have apenic spells during sleep. Sleep study was ordered to r/o OSA. This was performed 12/2014 and interpreted by Dr. Jetty Duhamellinton Bradley, pulmonologist. She was felt to have mild OSA. This was a home study. Unfortunately, she states that she was never notified of the results. She is unsure if she will require CPAP. She otherwise has been doing ok. She denies CP or dyspnea. She exercises no a treadmill w/o symptoms but has difficulty with steep inclines (exertional dyspnea). No symptoms on flat surfaces. Her husband continues to complain of her snoring. She notes frequent day time fatigue. She has also been bothered by increased stress. She lost her job. She fortunately still has some insurance coverage. She has had increased anxiety as a result and notes continued weight gain. She has been attempting to loose weight but this has been difficult for her.  EKG shows NSR. No ischemia. HR in the 90s. BP is elevated at 140/100. She checks her BP regularly at home and systolic pressures are "usually in the 160s" diastolic pressures are "usually in the 90s". Her medications were reviewed. It appears that she has only been taking her short acting metoprolol once daily. She does not recall why.    Current Outpatient Prescriptions  Medication Sig Dispense Refill  . aspirin 81 MG tablet Take 81 mg by mouth daily.    Marland Kitchen. buPROPion (WELLBUTRIN XL) 300 MG 24 hr tablet Take 300 mg by mouth daily.    Marland Kitchen.  FLUoxetine (PROZAC) 20 MG tablet Take 20 mg by mouth daily.    . metoprolol tartrate (LOPRESSOR) 25 MG tablet Take 1 tablet (25 mg total) by mouth 2 (two) times daily. 60 tablet 5  . omeprazole (PRILOSEC) 20 MG capsule Take 20 mg by mouth daily.    . SUMAtriptan (IMITREX) 100 MG tablet TAKE 1 TABLET (100 MG TOTAL) BY MOUTH AS NEEDED FOR MIGRAINE. MAY REPEAT IN 2 HOURS IF HEADACHE PERSISTS OR RECURS. 9 tablet 11  . topiramate (TOPAMAX) 25 MG tablet Take 2 tablets (50 mg total) by mouth 2 (two) times daily. 120 tablet 11   No current facility-administered medications for this visit.    No Known Allergies  Social History   Social History  . Marital Status: Married    Spouse Name: [name]  . Number of Children: 3  . Years of Education: college   Occupational History  . ASSISTANT DIRECTOR ITS [employer]   Social History Main Topics  . Smoking status: Former Smoker    Types: Cigarettes    Start date: 09/26/2009  . Smokeless tobacco: Never Used     Comment: Quit 2010  . Alcohol Use: 0.6 oz/week    1 Cans of beer per week     Comment: OCC  . Drug Use: No  . Sexual Activity: Not on file   Other Topics Concern  . Not on file   Social History Narrative   Patient lives  at home with her husband. [name]   Patient has 3 children.   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    Patient has a college education.   Caffeine- two cups daily.   Right handed.     Review of Systems: General: negative for chills, fever, night sweats or weight changes.  Cardiovascular: negative for chest pain, dyspnea on exertion, edema, orthopnea, palpitations, paroxysmal nocturnal dyspnea or shortness of breath Dermatological: negative for rash Respiratory: negative for cough or wheezing Urologic: negative for hematuria Abdominal: negative for nausea, vomiting, diarrhea, bright red blood per rectum, melena, or hematemesis Neurologic: negative for visual changes, syncope, or  dizziness All other systems reviewed and are otherwise negative except as noted above.    Blood pressure 140/100, pulse 91, height 5' 6.5" (1.689 m), weight 178 lb 1.9 oz (80.795 kg).  General appearance: alert, cooperative and no distress Neck: no carotid bruit and no JVD Lungs: clear to auscultation bilaterally Heart: regular rate and rhythm, S1, S2 normal, no murmur, click, rub or gallop Extremities: no LEE Pulses: 2+ and symmetric Skin: warm and dry Neurologic: Grossly normal  EKG NSR. 91 bpm.   ASSESSMENT AND PLAN:   1. H/o MVP: s/p surgical repair in 2011. 2D echo 08/2014 showed normally functioning mitral valve with mild MS and trace MR. She denies CP nor significant dyspnea. Cardiac exam is benign w/o murmurs or clicks.  2. HTN: BP is elevated and has been elevated at home. She admits to several life stressors. She recently loss her job but still has Training and development officerinsurance coverage. Her medications were reviewed. It appears that she has only been taking her short acting metoprolol once daily. She does not recall why. Her HR is in the 90s. Patient advised to increase to 25 mg BID. She was instructed to monitor BP and HR at home.  3. ? OSA: patient had a home sleep study, interpreted as "mild OSA". ? If she will need an official  sleep test at the sleep clinic. ? Need for OSA. Will forward to Dr. Eldridge Bradley for review.    PLAN: F/u in 6 months.   Robbie LisBrittainy Simmons PA-C 12/22/2015 4:57 PM

## 2015-12-22 NOTE — Telephone Encounter (Signed)
Called patient and informed her of Dr. Eldridge DaceVaranasi and Robbie LisBrittainy Simmons PA recommendations. Patient verbalized understanding and will call her dentist, so he his aware.

## 2015-12-22 NOTE — Telephone Encounter (Signed)
Per Dr. Hoyle BarrVaranasi's office note 08/2014, he outlined "SBE prophylaxis" under her history for MVP, so yes I would continue prophylactic antibiotics when going for routine dental procedures. Her dentist can administer.

## 2015-12-23 ENCOUNTER — Telehealth: Payer: Self-pay | Admitting: Interventional Cardiology

## 2015-12-23 MED ORDER — AMOXICILLIN 500 MG PO CAPS: ORAL_CAPSULE | ORAL | Status: AC

## 2015-12-23 NOTE — Telephone Encounter (Signed)
New MEssage  Pt requested to speak w/ rN about antibiotic she needs prescribed. Please call back and discuss.

## 2015-12-23 NOTE — Telephone Encounter (Addendum)
Seeing a dentist tomorrow and says Dr V said yesterday she needs SBE(MVR).  I let her know I will call into pharmacy

## 2016-01-01 ENCOUNTER — Encounter: Payer: Self-pay | Admitting: Physician Assistant

## 2016-01-01 ENCOUNTER — Encounter: Payer: Self-pay | Admitting: Internal Medicine

## 2016-01-01 NOTE — Progress Notes (Signed)
Sent msg to schedulers to arrange referral to Dr. Tresa EndoKelly for management of OSA per Dr. Eldridge DaceVaranasi. (I am covering Brittany's box while she is out and Dr. Eldridge DaceVaranasi recommended this.)

## 2016-01-03 ENCOUNTER — Other Ambulatory Visit: Payer: Self-pay | Admitting: Interventional Cardiology

## 2016-03-04 ENCOUNTER — Encounter: Payer: Self-pay | Admitting: Cardiovascular Disease

## 2016-03-04 ENCOUNTER — Ambulatory Visit (INDEPENDENT_AMBULATORY_CARE_PROVIDER_SITE_OTHER): Payer: BLUE CROSS/BLUE SHIELD | Admitting: Cardiovascular Disease

## 2016-03-04 VITALS — BP 130/82 | HR 85 | Ht 66.0 in | Wt 174.2 lb

## 2016-03-04 DIAGNOSIS — G4730 Sleep apnea, unspecified: Secondary | ICD-10-CM

## 2016-03-04 DIAGNOSIS — Z9889 Other specified postprocedural states: Secondary | ICD-10-CM

## 2016-03-04 DIAGNOSIS — I10 Essential (primary) hypertension: Secondary | ICD-10-CM | POA: Diagnosis not present

## 2016-03-04 DIAGNOSIS — G4733 Obstructive sleep apnea (adult) (pediatric): Secondary | ICD-10-CM

## 2016-03-04 NOTE — Patient Instructions (Signed)
Your physician has recommended that you have a sleep titration study. This test records several body functions during sleep, including: brain activity, eye movement, oxygen and carbon dioxide blood levels, heart rate and rhythm, breathing rate and rhythm, the flow of air through your mouth and nose, snoring, body muscle movements, and chest and belly movement.   Your physician recommends that you schedule a follow-up appointment in: 3 months in sleep clinic with Dr Tresa EndoKelly.

## 2016-03-06 NOTE — Progress Notes (Signed)
Melinda Bradley   1956-06-10  [id number]  Primary Physician Tawanna Solo, MD Primary Cardiologist: Dr. Irish Lack    HPI: Melinda Bradley is a 60 y.o. female presents for sleep clinic evaluation of obstructive sleep apnea..  Melinda Bradley is followed by Dr. Illene Bolus mitral valve disease and is status post mitral valve repair in 2011.  She has normal LV function and her last echo in April 2016 showed an EF of 60-65%.  She has a history of hypertension.  She has significant history of loud snoring, has been observed to have apneic spells during sleep.  Apparently, she was referred for a home sleep study on January 15, 2015.  The study was interpreted by Dr. Annamaria Boots.  The report indicates that her apnea hypopnea index was 8.0.  She spent 12.6 minutes under 90% oxygen saturation with the lowest saturation at 85%.  She snored for 23.7% of the night.  The patient was never told of any the results of her sleep study.  Over the past year, she feels her sleep is getting worse.  She is waking up gasping for breath.  She wakes up numerous times per night.  Has issues with a morning headache and has a history of migraine disease.  She admits to significant weight gain since 2011 from 135 pounds to 174 pounds.  She typically goes to bed between 11 PM and 2 AM.  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  She now wakes up approximately 9 AM.  She now presents for sleep evaluation.  Epworth Sleepiness Scale score was calculated in the office today and this endorsed at 13 is compatible with excessive daytime sleepiness.   Epworth Sleepiness Scale: Situation   Chance of Dozing/Sleeping (0 = never , 1 = slight chance , 2 = moderate chance , 3 = high chance )   sitting and reading 3   watching TV 3   sitting inactive in a public place 2   being a passenger in a motor vehicle for an hour or more 2   lying down in the afternoon 2   sitting and talking to someone 0   sitting quietly after  lunch (no alcohol) 1   while stopped for a few minutes in traffic as the driver 0   Total Score  13    Past Medical History:  Diagnosis Date  . Heart burn   . Menorrhagia   . Migraine   . Symptomatic menopausal or female climacteric states   . Vertigo     Past Surgical History:  Procedure Laterality Date  . DILATION AND CURETTAGE OF UTERUS    . MITRAL VALVE REPAIR  10/2009  . TONSILLECTOMY      No Known Allergies  Current Outpatient Prescriptions  Medication Sig Dispense Refill  . amoxicillin (AMOXIL) 500 MG capsule Take 4 caps by mouth 1 hour prior to dental work 4 capsule 1  . aspirin 81 MG tablet Take 81 mg by mouth daily.    Marland Kitchen FLUoxetine (PROZAC) 20 MG tablet Take 20 mg by mouth daily.    . metoprolol tartrate (LOPRESSOR) 25 MG tablet Take 1 tablet (25 mg total) by mouth 2 (two) times daily. 60 tablet 11  . omeprazole (PRILOSEC) 20 MG capsule Take 20 mg by mouth as needed.    . SUMAtriptan (IMITREX) 100 MG tablet TAKE 1 TABLET (100 MG TOTAL) BY MOUTH AS NEEDED FOR MIGRAINE. MAY REPEAT IN 2 HOURS  IF HEADACHE PERSISTS OR RECURS. 9 tablet 11  . topiramate (TOPAMAX) 25 MG tablet Take 2 tablets (50 mg total) by mouth 2 (two) times daily. 120 tablet 11   No current facility-administered medications for this visit.     Social History   Social History  . Marital status: Married    Spouse name: Jenny Reichmann  . Number of children: 3  . Years of education: college   Occupational History  . 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 Smoking status: Former Smoker    Types: Cigarettes    Start date: 09/26/2009  . Smokeless tobacco: Never Used     Comment: Quit 2010  . Alcohol use 0.6 oz/week    1 Cans of beer per week     Comment: OCC  . Drug use: No  . Sexual activity: Not on file   Other Topics Concern  . Not on file   Social History Narrative   Patient lives at home with her husband. Jenny Reichmann)   Patient has 3 children.   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    Patient has a college education.   Caffeine- two cups daily.   Right handed.   Additional social history is notable in that she is just within the [place] job.  She will be trying to seek Employment.  Family History  Problem Relation Age of Onset  . Hiatal hernia Mother   . Headache Father   . Heart Problems Father    Additional family history is notable in that her father uses BiPAP therapy and has severe sleep apnea and is status post UPPP procedure.  She also had grandparents with sleep apnea.  She has 2 sisters and 3 children.   ROS General: Negative; No fevers, chills, or night sweats HEENT: Negative; No changes in vision or hearing, sinus congestion, difficulty swallowing Pulmonary: Negative; No cough, wheezing, shortness of breath, hemoptysis Cardiovascular: Negative; No chest pain, presyncope, syncope, palpatations GI: Negative; No nausea, vomiting, diarrhea, or abdominal pain GU: Negative; No dysuria, hematuria, or difficulty voiding Musculoskeletal: Negative; no myalgias, joint pain, or weakness Hematologic: Negative; no easy bruising, bleeding Endocrine: Negative; no heat/cold intolerance Neuro: Negative; no changes in balance, headaches Skin: Negative; No rashes or skin lesions Psychiatric: Negative; No behavioral problems, depression Sleep: Positive for obstructive sleep apnea daytime sleepiness, hypersomnolence; nobruxism, restless legs, hypnogognic hallucinations, no cataplexy   Physical Exam BP 130/82 (BP Location: Left Arm, Patient Position: Sitting, Cuff Size: Normal)   Pulse 85   Ht _0  (1.676 m)   Wt 174 lb 3.2 oz (79 kg)   BMI 28.12 kg/m     Wt Readings from Last 3 Encounters:  03/04/16 174 lb 3.2 oz (79 kg)  12/22/15 178 lb 1.9 oz (80.8 kg)  10/15/15 179 lb 3.2 oz (81.3 kg)   General: Alert, oriented, no distress.  Skin: normal turgor, no rashes HEENT: Normocephalic, atraumatic. Pupils round and  reactive; sclera anicteric; extraocular muscles intact; Fundi disc flat; no hemorrhages or exudates. Nose without nasal septal hypertrophy Mouth/Parynx benign; Mallinpatti scale 3 Neck: No JVD, no carotid bruits Lungs: clear to ausculatation and percussion; no wheezing or rales  Chest wall: No tenderness to palpation Heart: RRR, s1 s2 normal .  Faint systolic murmur.  No S3 or S4 gallop.  No rubs thrills or heaves Abdomen: soft, nontender; no hepatosplenomehaly, BS+; abdominal aorta nontender and not dilated by palpation. Back: No CVA tenderness Pulses 2+  Extremities: no clubbing cyanosis or edema, Homan's sign negative  Neurologic: grossly nonfocal; cranial nerves intact. Psychological: Normal affect and mood.  ECG (independently read by me): Not done today .  I reviewed her last ECG from 12/22/2015, which shows normal sinus rhythm at 91.  There are nonspecific T changes.  LABS:  BMP Latest Ref Rng & Units 09/24/2014 11/08/2009 11/06/2009  Glucose 70 - 99 mg/dL 84 109(H) 127(H)  BUN 6 - 23 mg/dL _0 Creatinine 0.40 - 1.20 mg/dL 0.98 0.77 0.94  Sodium 135 - 145 mEq/L 138 138 137  Potassium 3.5 - 5.1 mEq/L 4.1 3.6 4.0  Chloride 96 - 112 mEq/L 107 106 106  CO2 19 - 32 mEq/L _1 Calcium 8.4 - 10.5 mg/dL 9.7 8.2(L) 8.3(L)     Hepatic Function Latest Ref Rng & Units 10/31/2009  Total Protein 6.0 - 8.3 g/dL 6.0  Albumin 3.5 - 5.2 g/dL 3.8  AST 0 - 37 U/L 17  ALT 0 - 35 U/L 17  Alk Phosphatase 39 - 117 U/L 58  Total Bilirubin 0.3 - 1.2 mg/dL 0.3     CBC Latest Ref Rng & Units 11/08/2009 11/06/2009 11/05/2009  WBC 4.0 - 10.5 K/uL 8.0 7.0 7.5  Hemoglobin 12.0 - 15.0 g/dL 8.4(L) 8.2(L) 8.0(L)  Hematocrit 36.0 - 46.0 % 24.7(L) 24.3(L) 23.9(L)  Platelets 150 - 400 K/uL 183 110(L) 106(L)     Lipid Panel  No results found for: CHOL, TRIG, HDL, CHOLHDL, VLDL, LDLCALC, LDLDIRECT   RADIOLOGY: No results found.    ASSESSMENT AND PLAN: Melinda Bradley is a very pleasant  60 year old female who is status post mitral valve repair in 2011.  She has a history of mild hypertension and is on beta blocker therapy.  She has a sleep history notable for loud snoring as well as witnessed apnea.  A home sleep study was done 1 year ago which revealed that she had mild sleep apnea overall with an AHI of 8.0/h.  However, the home study does not allow for sleep stage and sleep architecture and it is very possible that she may have more significant sleep apnea during REM sleep.  She had significant desaturation of oxygen to 85% and snored for almost 25% of the evening.  Over the past year, her symptom complex has progressed.  Her sleep is now very poor.  She wakes up frequently.  She notes morning headaches, and oftentimes finds himself gasping for breath.  I have recommended CPAP titration for further evaluation and initiation of CPAP therapy.  We will schedule this study to be done [facility].  I will see her back in a sleep clinic after initiation of CPAP therapy with her new CPAP machine and further recommendations will be made at that time.  Time spent: 25 minutes  Troy Sine, MD, [facility]  03/06/2016 10:53 PM

## 2016-04-13 DIAGNOSIS — Z23 Encounter for immunization: Secondary | ICD-10-CM | POA: Diagnosis not present

## 2016-04-19 ENCOUNTER — Ambulatory Visit (HOSPITAL_BASED_OUTPATIENT_CLINIC_OR_DEPARTMENT_OTHER): Payer: BLUE CROSS/BLUE SHIELD | Attending: Cardiovascular Disease | Admitting: Cardiovascular Disease

## 2016-04-19 VITALS — Ht 66.0 in | Wt 172.0 lb

## 2016-04-19 DIAGNOSIS — Z7982 Long term (current) use of aspirin: Secondary | ICD-10-CM | POA: Insufficient documentation

## 2016-04-19 DIAGNOSIS — R0683 Snoring: Secondary | ICD-10-CM | POA: Diagnosis not present

## 2016-04-19 DIAGNOSIS — G4730 Sleep apnea, unspecified: Secondary | ICD-10-CM | POA: Diagnosis present

## 2016-04-19 DIAGNOSIS — G4733 Obstructive sleep apnea (adult) (pediatric): Secondary | ICD-10-CM | POA: Insufficient documentation

## 2016-04-19 DIAGNOSIS — Z79899 Other long term (current) drug therapy: Secondary | ICD-10-CM | POA: Insufficient documentation

## 2016-04-20 ENCOUNTER — Other Ambulatory Visit (HOSPITAL_COMMUNITY)
Admission: RE | Admit: 2016-04-20 | Discharge: 2016-04-20 | Disposition: A | Discharge status: Home / Self Care | Payer: BLUE CROSS/BLUE SHIELD | Source: Ambulatory Visit | Attending: Family Medicine | Admitting: Family Medicine

## 2016-04-20 ENCOUNTER — Other Ambulatory Visit: Payer: Self-pay | Admitting: Family Medicine

## 2016-04-20 DIAGNOSIS — Z124 Encounter for screening for malignant neoplasm of cervix: Secondary | ICD-10-CM | POA: Insufficient documentation

## 2016-04-20 DIAGNOSIS — E785 Hyperlipidemia, unspecified: Secondary | ICD-10-CM | POA: Diagnosis not present

## 2016-04-20 DIAGNOSIS — Z0000 Encounter for general adult medical examination without abnormal findings: Secondary | ICD-10-CM | POA: Diagnosis not present

## 2016-04-20 DIAGNOSIS — Z1151 Encounter for screening for human papillomavirus (HPV): Secondary | ICD-10-CM | POA: Insufficient documentation

## 2016-04-20 DIAGNOSIS — Z1211 Encounter for screening for malignant neoplasm of colon: Secondary | ICD-10-CM | POA: Diagnosis not present

## 2016-04-21 LAB — CYTOLOGY - PAP
Diagnosis: NEGATIVE
HPV (WINDOPATH): NOT DETECTED

## 2016-04-25 NOTE — Procedures (Signed)
Patient Name: Melinda Bradley, Melinda Bradley Study Date: 04/19/2016 Gender: Female D.O.B: Jan 14, 1956 Age (years): 89 Referring Provider: Nicki Guadalajarahomas Jalexus Brett MD, ABSM Height (inches): 66 Interpreting Physician: Nicki Guadalajarahomas Eshawn Coor MD, ABSM Weight (lbs): 172 RPSGT: Rolene ArbourMcConnico, Yvonne BMI: 28 MRN: [id number] Neck Size: 14.00  CLINICAL INFORMATION The patient is referred for a CPAP titration to treat sleep apnea. Recent Epworth Sleepiness Scale score: 13 at office visit 03/04/2016 Date of NPSG, Split Night or HST: Out of center study 12/23/2014:  AHI 8/h with Oxygen nadir at 85%  SLEEP STUDY TECHNIQUE As per the AASM Manual for the Scoring of Sleep and Associated Events v2.3 (April 2016) with a hypopnea requiring 4% desaturations. The channels recorded and monitored were frontal, central and occipital EEG, electrooculogram (EOG), submentalis EMG (chin), nasal and oral airflow, thoracic and abdominal wall motion, anterior tibialis EMG, snore microphone, electrocardiogram, and pulse oximetry. Continuous positive airway pressure (CPAP) was initiated at the beginning of the study and titrated to treat sleep-disordered breathing.  MEDICATIONS amoxicillin (AMOXIL) 500 MG capsule aspirin 81 MG tablet FLUoxetine (PROZAC) 20 MG tablet metoprolol tartrate (LOPRESSOR) 25 MG tablet omeprazole (PRILOSEC) 20 MG capsule SUMAtriptan (IMITREX) 100 MG tablet  TECHNICIAN COMMENTS Comments added by technician: Patient had difficulty initiating sleep. Patient was restless all through the night.   RESPIRATORY PARAMETERS Optimal PAP Pressure (cm): 10 AHI at Optimal Pressure (/hr): 3.9 Overall Minimal O2 (%): 81.00 Supine % at Optimal Pressure (%): 0 Minimal O2 at Optimal Pressure (%): 81.0    SLEEP ARCHITECTURE The study was initiated at 11:04:38 PM and ended at 5:06:59 AM. Sleep onset time was 25.3 minutes and the sleep efficiency was 66.1%. The total sleep time was 239.5 minutes. The patient spent 4.80% of the night in  stage N1 sleep, 81.00% in stage N2 sleep, 0.00% in stage N3 and 14.19% in REM.Stage REM latency was 179.0 minutes Wake after sleep onset was 97.5. Alpha intrusion was absent. Supine sleep was 7.07%.  CARDIAC DATA The 2 lead EKG demonstrated sinus rhythm. The mean heart rate was 89.69 beats per minute. Other EKG findings include: None.  LEG MOVEMENT DATA The total Periodic Limb Movements of Sleep (PLMS) were 97. The PLMS index was 24.30. A PLMS index of <15 is considered normal in adults.  IMPRESSIONS - The patient was titrated up to 10 cm of water pressure; The AHI at 10 cm was 3.9/h. - Central sleep apnea was not noted during this titration (CAI = 0.0/h). - Moderate oxygen desaturations were observed during this titration (min O2 = 81.00%). - Reduced sleep efficiency - The patient snored with Soft snoring volume during this titration study. - No cardiac abnormalities were observed during this study. - Mild periodic limb movements were observed during this study. Arousals associated with PLMs were rare.  DIAGNOSIS - Obstructive Sleep Apnea (327.23 [G47.33 ICD-10])  RECOMMENDATIONS - In this symptomatic patient recommend an initial trial of CPAP therapy with EPR at 11 cm H2O with a Small size Fisher&Paykel Full Face Mask Simplus mask and heated humidification. - Avoid alcohol, sedatives and other CNS depressants that may worsen sleep apnea and disrupt normal sleep architecture. - Sleep hygiene should be reviewed to assess factors that may improve sleep quality. - Weight management and regular exercise should be initiated or continued. - Recommend a download in 30 days and sleep clinic evaluation.   [Electronically signed] 04/25/2016 10:21 PM  Nicki Guadalajarahomas Coburn Knaus MD, [facility] - Lake CityFACC, ABSM Diplomate, American Board of Sleep Medicine   NPI: [id number]  [facility] PH: [phone]   FX: [phone] [place]

## 2016-04-27 ENCOUNTER — Telehealth: Payer: Self-pay | Admitting: Cardiovascular Disease

## 2016-04-27 ENCOUNTER — Telehealth: Payer: Self-pay | Admitting: Interventional Cardiology

## 2016-04-27 NOTE — Telephone Encounter (Signed)
Patient calling for test results. °

## 2016-04-27 NOTE — Telephone Encounter (Signed)
Called and spoke with patient in reference to her sleep study. All questions were answered to patient's satisfaction. [facility] referral will be made.

## 2016-04-27 NOTE — Telephone Encounter (Signed)
Spoke with pt, she had sleep titration study, aware CPAP has been recommended and the information at this point will be sent to the company to get things started. She has a lot of questions and wants a call from someone to discuss prior to the orders being sent. Will forward to wanda, dr Landry Dykekelly's ma to call the patient to answer her questions.

## 2016-04-27 NOTE — Telephone Encounter (Signed)
New Message:    Pt wanted Dr Vida RollerVaranashi to know that her Medical doctor have put her on a Cholesterol medicine(Pravastatin 40 mg once a dat).

## 2016-04-27 NOTE — Telephone Encounter (Signed)
**Note De-Identified Melinda Bradley Obfuscation** The pt is advised that I will make Dr Eldridge DaceVaranasi aware that she has started taking Pravastatin 40 mg daily. She verbalized understanding and thanked me for calling her back.  The pts medications list has been updated.

## 2016-04-30 NOTE — Progress Notes (Signed)
Patient notified of sleep results and recommendations. Referral sent to [facility] for set up.

## 2016-05-03 ENCOUNTER — Telehealth: Payer: Self-pay | Admitting: Cardiovascular Disease

## 2016-05-03 NOTE — Telephone Encounter (Signed)
Returned a call to patient. Left message the [facility] company just received her CPAP orders on Friday. As I explained to her when I spoke with her last week,they have to get approval from her insurance company. Once this has been done they will contact her for set up. [facility] company name and # left in the message if she wants to call to get approximate time from them.

## 2016-05-03 NOTE — Telephone Encounter (Signed)
Returned call to patient.She stated she has not received her CPAP machine and she is going out of town this week.Stated she is concerned she has not heard back from DME company and she would like to take care of this matter as soon as possible.Advised I will send this message to ValatieWanda.

## 2016-05-03 NOTE — Telephone Encounter (Signed)
Returning call.

## 2016-05-03 NOTE — Telephone Encounter (Signed)
Pt said she did not heard from anybody concerning her C-Pap machine. She says she is going out of town this week,she needs her machine asap please.

## 2016-05-04 DIAGNOSIS — G4733 Obstructive sleep apnea (adult) (pediatric): Secondary | ICD-10-CM | POA: Diagnosis not present

## 2016-06-03 DIAGNOSIS — G4733 Obstructive sleep apnea (adult) (pediatric): Secondary | ICD-10-CM | POA: Diagnosis not present

## 2016-06-11 ENCOUNTER — Encounter: Payer: Self-pay | Admitting: Cardiovascular Disease

## 2016-06-11 ENCOUNTER — Ambulatory Visit (INDEPENDENT_AMBULATORY_CARE_PROVIDER_SITE_OTHER): Payer: BLUE CROSS/BLUE SHIELD | Admitting: Cardiovascular Disease

## 2016-06-11 VITALS — BP 134/93 | HR 68 | Ht 66.0 in | Wt 174.0 lb

## 2016-06-11 DIAGNOSIS — G4733 Obstructive sleep apnea (adult) (pediatric): Secondary | ICD-10-CM

## 2016-06-11 DIAGNOSIS — I10 Essential (primary) hypertension: Secondary | ICD-10-CM | POA: Diagnosis not present

## 2016-06-11 DIAGNOSIS — Z9889 Other specified postprocedural states: Secondary | ICD-10-CM | POA: Diagnosis not present

## 2016-06-11 NOTE — Patient Instructions (Addendum)
Your physician recommends that you schedule a follow-up appointment in: as needed with Dr Tresa Endokelly for sleep.  A order has been given to you today to carry to [facility] for a new mask.

## 2016-06-12 NOTE — Progress Notes (Signed)
Cardiology Office Note    Date:  06/12/2016   ID:  Melinda Bradley, DOB 1956/05/05, MRN [id number]  PCP:  Tawanna Solo, MD  Cardiologist:  Shelva Majestic, MD (sleep); Ulysses EVALUATION  History of Present Illness:  Melinda Bradley is a 60 y.o. female who is referred for sleep clinic evaluation following initiation of CPAP therapy.  Melinda Bradley is a cardiology patient of Dr. Irish Lack.  She has a history of mitral valve disease and is status post mitral valve repair in 2011.  She has a history of snoring loudly and has had witnessed apneic spells.  In 2016.  She apparently underwent a home study which was interpreted by Dr. Annamaria Boots to have mild obstructive sleep apnea. At that time, she was never notified of the results. I reviewed the home study which revealed an AHI of 8.0.  Her lowest oxygen saturation was 85%.  She snored for 23.7% of the night.   She had recently been seen by Ellen Henri, PA-C and had complaints of fatigability, snoring, frequent awakenings, and was ultimately referred for a CPAP titration study on 04/19/2016 at the [place] [facility].  On her CPAP titration, she was titrated up to 10 cm water pressure and her AHI at 10 cm was 3.9 per hour.  As result, an initial CPAP pressure of 11 cm was recommended.  She ultimately was referred to [facility] for her DME company.  She has had difficulty with them.  Initially, he was having problems with it every year, but the mass seemed to fit well.  She was given a new headgear but needed a new mask to fit this and this current mass seems to be associated with significant leak.  Her CPAP set up date was November 7.  Shortly after receiving her mass, she had to go to [place] and there was several consecutive days where she did not have her CPAP unit.  Over the past 30 days, compliance of usage stays was 70% and initially because she was having some headgear and mask issues.  Her sleep  duration with mass use was inadequate.,  Over the past several weeks she has been using her mask all night for at least 6+ hours, but over the past 30 days.  This was only 63%.  She is unaware of any breakthrough snoring.  She has been having significant difficulty with the leak of her present mask.  Epworth Sleepiness Scale score was calculated today and this endorsed at 9 with her high chance of dozing while sitting and reading, watching television, and a mild chance of dozing while sitting inactive in a public place, is a passenger in a car, and lying down to rest in the afternoon.  He presents for evaluation.   Past Medical History:  Diagnosis Date  . Heart burn   . Menorrhagia   . Migraine   . Symptomatic menopausal or female climacteric states   . Vertigo     Past Surgical History:  Procedure Laterality Date  . DILATION AND CURETTAGE OF UTERUS    . MITRAL VALVE REPAIR  10/2009  . TONSILLECTOMY      Current Medications: Outpatient Medications Prior to Visit  Medication Sig Dispense Refill  . amoxicillin (AMOXIL) 500 MG capsule Take 4 caps by mouth 1 hour prior to dental work 4 capsule 1  . aspirin 81 MG tablet Take 81 mg by mouth daily.    Marland Kitchen FLUoxetine (PROZAC) 20  MG tablet Take 20 mg by mouth daily.    . metoprolol tartrate (LOPRESSOR) 25 MG tablet Take 1 tablet (25 mg total) by mouth 2 (two) times daily. 60 tablet 11  . omeprazole (PRILOSEC) 20 MG capsule Take 20 mg by mouth as needed.    . pravastatin (PRAVACHOL) 40 MG tablet Take 1 tablet (40 mg total) by mouth every evening. 90 tablet 3  . SUMAtriptan (IMITREX) 100 MG tablet TAKE 1 TABLET (100 MG TOTAL) BY MOUTH AS NEEDED FOR MIGRAINE. MAY REPEAT IN 2 HOURS IF HEADACHE PERSISTS OR RECURS. 9 tablet 11  . topiramate (TOPAMAX) 25 MG tablet Take 2 tablets (50 mg total) by mouth 2 (two) times daily. 120 tablet 11   No facility-administered medications prior to visit.      Allergies:   Patient has no known allergies.   Social  History   Social History  . Marital status: Married    Spouse name: Melinda Bradley  . Number of children: 3  . Years of education: college   Occupational History  . ASSISTANT DIRECTOR ITS [employer]   Social History Main Topics  . Smoking status: Former Smoker    Types: Cigarettes    Start date: 09/26/2009  . Smokeless tobacco: Never Used     Comment: Quit 2010  . Alcohol use 0.6 oz/week    1 Cans of beer per week     Comment: OCC  . Drug use: No  . Sexual activity: Not Asked   Other Topics Concern  . None   Social History Narrative   Patient lives at home with her husband. (Melinda Bradley)   Patient has 3 children.   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    Patient has a college education.   Caffeine- two cups daily.   Right handed.     Family History:  The patient's family history includes Headache in her father; Heart Problems in her father; Hiatal hernia in her mother.   ROS General: Negative; No fevers, chills, or night sweats;  HEENT: Negative; No changes in vision or hearing, sinus congestion, difficulty swallowing Pulmonary: Negative; No cough, wheezing, shortness of breath, hemoptysis Cardiovascular: Negative; No chest pain, presyncope, syncope, palpitations GI: Negative; No nausea, vomiting, diarrhea, or abdominal pain GU: Negative; No dysuria, hematuria, or difficulty voiding Musculoskeletal: Negative; no myalgias, joint pain, or weakness Hematologic/Oncology: Negative; no easy bruising, bleeding Endocrine: Negative; no heat/cold intolerance; no diabetes Neuro: Negative; no changes in balance, headaches Skin: Negative; No rashes or skin lesions Psychiatric: Negative; No behavioral problems, depression Sleep: Negative; No snoring, daytime sleepiness, hypersomnolence, bruxism, restless legs, hypnogognic hallucinations, no cataplexy Other comprehensive 14 point system review is negative.   PHYSICAL EXAM:   VS:  BP (!) 134/93   Pulse 68   Ht 5' 6"  (1.676 m)   Wt 174 lb (78.9 kg)   BMI 28.08 kg/m     Repeat blood pressure was 130/88  Wt Readings from Last 3 Encounters:  06/11/16 174 lb (78.9 kg)  04/19/16 172 lb (78 kg)  03/04/16 174 lb 3.2 oz (79 kg)    General: Alert, oriented, no distress.  Skin: normal turgor, no rashes, warm and dry HEENT: Normocephalic, atraumatic. Pupils equal round and reactive to light; sclera anicteric; extraocular muscles intact; Fundi No hemorrhages or exudates. Nose without nasal septal hypertrophy Mouth/Parynx benign; Mallinpatti scale 3 Neck: No JVD, no carotid bruits; normal carotid upstroke Lungs: clear to ausculatation and percussion; no wheezing or rales Chest wall:   without tenderness to palpitation Heart: PMI not displaced, RRR, s1 s2 normal, 1/6 systolic murmur, no diastolic murmur, no rubs, gallops, thrills, or heaves Abdomen: soft, nontender; no hepatosplenomehaly, BS+; abdominal aorta nontender and not dilated by palpation. Back: no CVA tenderness Pulses 2+ Musculoskeletal: full range of motion, normal strength, no joint deformities Extremities: no clubbing cyanosis or edema, Homan's sign negative  Neurologic: grossly nonfocal; Cranial nerves grossly wnl Psychologic: Normal mood and affect   Studies/Labs Reviewed:   EKG:  EKG is not ordered today. However, I have independently reviewed her prior ECGs and her last ECG from 12/22/2015 shows normal sinus rhythm at 91 bpm.  Recent Labs: BMP Latest Ref Rng & Units 09/24/2014 11/08/2009 11/06/2009  Glucose 70 - 99 mg/dL 84 109(H) 127(H)  BUN 6 - 23 mg/dL 13 6 14  Creatinine 0.40 - 1.20 mg/dL 0.98 0.77 0.94  Sodium 135 - 145 mEq/L 138 138 137  Potassium 3.5 - 5.1 mEq/L 4.1 3.6 4.0  Chloride 96 - 112 mEq/L 107 106 106  CO2 19 - 32 mEq/L 27 28 27  Calcium 8.4 - 10.5 mg/dL 9.7 8.2(L) 8.3(L)     Hepatic Function Latest Ref Rng & Units 10/31/2009  Total Protein 6.0 - 8.3 g/dL 6.0  Albumin 3.5 - 5.2 g/dL 3.8  AST 0 - 37 U/L 17  ALT 0 -  35 U/L 17  Alk Phosphatase 39 - 117 U/L 58  Total Bilirubin 0.3 - 1.2 mg/dL 0.3    CBC Latest Ref Rng & Units 11/08/2009 11/06/2009 11/05/2009  WBC 4.0 - 10.5 K/uL 8.0 7.0 7.5  Hemoglobin 12.0 - 15.0 g/dL 8.4(L) 8.2(L) 8.0(L)  Hematocrit 36.0 - 46.0 % 24.7(L) 24.3(L) 23.9(L)  Platelets 150 - 400 K/uL 183 110(L) 106(L)   Lab Results  Component Value Date   MCV 87.1 11/08/2009   MCV 87.5 11/06/2009   MCV 87.9 11/05/2009   No results found for: TSH Lab Results  Component Value Date   HGBA1C  10/31/2009    5.6 (NOTE)                                                                       According to the ADA Clinical Practice Recommendations for 2011, when HbA1c is used as a screening test:   >=6.5%   Diagnostic of Diabetes Mellitus           (if abnormal result  is confirmed)  5.7-6.4%   Increased risk of developing Diabetes Mellitus  References:Diagnosis and Classification of Diabetes Mellitus,Diabetes Care,2011,34(Suppl 1):S62-S69 and Standards of Medical Care in         Diabetes - 2011,Diabetes Care,2011,34  (Suppl 1):S11-S61.     BNP No results found for: BNP  ProBNP    Component Value Date/Time   PROBNP 111.0 (H) 09/24/2014 1202     Lipid Panel  No results found for: CHOL, TRIG, HDL, CHOLHDL, VLDL, LDLCALC, LDLDIRECT   RADIOLOGY: No results found.   Additional studies/ records that were reviewed today include:  I reviewed her office records.  I have reviewed the home sleep study, which was interpreted by Dr. Clint Young.  I reviewed the CPAP titration trial, as well as recent download's.    ASSESSMENT:    1. OSA (obstructive   sleep apnea)   2. Essential hypertension   3. S/P MVR (mitral valve repair)      PLAN:  Ms. Towanna Sandford is a 60-year-old female who has cardiovascular comorbidities including mitral valve disease and is status post mitral valve repair.  She has a history of at least mild obstructive sleep apnea and she had a home study in 2016 which  demonstrated this.  For the past 6 weeks, she has been on CPAP therapy.  She has had issues with proper fitting of her headgear as well as her mask.  She has a ResMed F 20 headgear but her mask is a large which is too big for her face.  This is the reason why she is having significant leak.  I have ordered a new mask, which needs to be a medium and I suspect a small, maybe 2 small.  Since she has returned from her trip to [place] in mid-November, she has been using CPAP with improved compliance.  Last week, she has been averaging over 6 hours of use per night and I suspect she will me compliance with greater than 4 hours of use.  She is unaware of breakthrough snoring.  I discussed with her the importance of continued therapy particularly with reference to affective untreated sleep apnea on cardiovascular health.  If she has issues with her DME company in the future we will change her to [facility] cannot do this presently, since the billing process has been initiated for her CPAP with her current provider.   Medication Adjustments/Labs and Tests Ordered: Current medicines are reviewed at length with the patient today.  Concerns regarding medicines are outlined above.  Medication changes, Labs and Tests ordered today are listed in the Patient Instructions below. Patient Instructions  Your physician recommends that you schedule a follow-up appointment in: as needed with Dr kelly for sleep.  A order has been given to you today to carry to [facility] for a new mask.    Signed, Thomas Kelly, MD  06/12/2016 1:10 PM    [facility] [address], [address], , [place]  [address] Phone: [phone]    

## 2016-07-04 DIAGNOSIS — G4733 Obstructive sleep apnea (adult) (pediatric): Secondary | ICD-10-CM | POA: Diagnosis not present

## 2016-08-04 DIAGNOSIS — G4733 Obstructive sleep apnea (adult) (pediatric): Secondary | ICD-10-CM | POA: Diagnosis not present

## 2016-09-01 DIAGNOSIS — G4733 Obstructive sleep apnea (adult) (pediatric): Secondary | ICD-10-CM | POA: Diagnosis not present

## 2016-10-13 ENCOUNTER — Encounter: Payer: Self-pay | Admitting: Nurse Practitioner

## 2016-10-13 ENCOUNTER — Encounter (INDEPENDENT_AMBULATORY_CARE_PROVIDER_SITE_OTHER): Payer: Self-pay

## 2016-10-13 ENCOUNTER — Ambulatory Visit (INDEPENDENT_AMBULATORY_CARE_PROVIDER_SITE_OTHER): Payer: BLUE CROSS/BLUE SHIELD | Admitting: Nurse Practitioner

## 2016-10-13 VITALS — BP 146/98 | HR 91 | Wt 171.0 lb

## 2016-10-13 DIAGNOSIS — G43809 Other migraine, not intractable, without status migrainosus: Secondary | ICD-10-CM

## 2016-10-13 MED ORDER — SUMATRIPTAN SUCCINATE 100 MG PO TABS: ORAL_TABLET | ORAL | 11 refills | Status: AC

## 2016-10-13 NOTE — Progress Notes (Signed)
[facility]  PATIENT: Melinda Bradley DOB: 06/06/1956   REASON FOR VISIT: follow up for migraines HISTORY FROM:Patient    HISTORY OF PRESENT ILLNESS:UPDATE 10/13/16 CMMs. Melinda Bradley, 61 year old female returns for followup.  Her migraines are in  good control with Topamax   daily. She does complain with some numbness and tingling in her feet. Imitrex works acutely. She has a history of mitral valve replacement in 2010. She has been diagnosed with obstructive sleep apnea and is on CPAP since last seen. Because she is getting better sleep she has less headaches She returns for reevaluation . She needs refills on her medications  HISTORY: She was evaluated for "dizzy spells" since 2007-by Dr. Thad Ranger on 01/12/10. One severe enough to fall out of chair; went to ED, told had panic attack- went away until 2009, recurred, again negative w/u- found to have murmur at that time, eventually has mitral valve repair May 2011- no further spells from 2009 until June, recurred- had several since- can occur at rest, even in sleep- feel unsteady, sweaty, flushed, then very tired for rest of day and next- room spins counterclockwise, nausea after, no subjective nystagmus, no visual change- lasts about and resolves- helps to sit still, use cold rags- no ALOC- no assoc HA; long h/o migraine, better w/ hormone- on metoprolol since surg, just stopped Coumadin- She fell out of the chair with another episode 02/25/10. Topamax was increased to  daily. EEG was done and was normal. She has migraines and doing well on Topamax. 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 CT angiogram normal in the past. No further events with Topamax.  03/19/13: Patient returns for followup she had a severe migraine last week associated with nausea and vomiting. For whatever reason she has decreased her Topamax to  75 mg daily. Imitrex works acutely but typically she has to take an additional tablet and her pills are 50 mg. She has nothing to take for nausea. She is on Wellbutrin for depression and Prozac was recently added. Her daughter is getting married in 2 weeks and this is stressful for her. She is also going through menopause and has difficulty sleeping    REVIEW OF SYSTEMS: Full 14 system review of systems performed and notable only for those listed, all others are neg:  Constitutional: neg  Cardiovascular: neg Ear/Nose/Throat: neg  Skin: neg Eyes: neg Respiratory: neg Gastroitestinal: neg  Hematology/Lymphatic: neg  Endocrine: neg Musculoskeletal:neg Allergy/Immunology: neg Neurological: Migraine headaches, numbness in the feet Psychiatric: neg Sleep : Obstructive sleep apnea on CPAP   ALLERGIES: No Known Allergies  HOME MEDICATIONS: Outpatient Medications Prior to Visit  Medication Sig Dispense Refill  . aspirin 81 MG tablet Take 81 mg by mouth daily.    Marland Kitchen FLUoxetine (PROZAC) 20 MG tablet Take 20 mg by mouth daily.    . metoprolol tartrate (LOPRESSOR) 25 MG tablet Take 1 tablet (25 mg total) by mouth 2 (two) times daily. 60 tablet 11  . omeprazole (PRILOSEC) 20 MG capsule Take 20 mg by mouth as needed.    . pravastatin (PRAVACHOL) 40 MG tablet Take 1 tablet (40 mg total) by mouth every evening. 90 tablet 3  . SUMAtriptan (IMITREX) 100 MG tablet TAKE 1 TABLET (100 MG TOTAL) BY MOUTH AS NEEDED FOR MIGRAINE. MAY REPEAT IN 2 HOURS IF HEADACHE PERSISTS OR RECURS. 9 tablet 11  . topiramate (TOPAMAX) 25 MG  tablet Take 2 tablets (50 mg total) by mouth 2 (two) times daily. 120 tablet 11  . amoxicillin (AMOXIL) 500 MG capsule Take 4 caps by mouth 1 hour prior to dental work (Patient not taking: Reported on 10/13/2016) 4 capsule 1   No facility-administered medications prior to visit.     PAST MEDICAL HISTORY: Past Medical History:  Diagnosis Date  . Heart burn   . Menorrhagia   .  Migraine   . Symptomatic menopausal or female climacteric states   . Vertigo     PAST SURGICAL HISTORY: Past Surgical History:  Procedure Laterality Date  . DILATION AND CURETTAGE OF UTERUS    . MITRAL VALVE REPAIR  10/2009  . TONSILLECTOMY      FAMILY HISTORY: Family History  Problem Relation Age of Onset  . Hiatal hernia Mother   . Atrial fibrillation Mother   . Headache Father   . Heart Problems Father   . Arthritis-Osteo Sister     SOCIAL HISTORY: Social History   Social History  . Marital status: Married    Spouse name: Melinda Bradley  . Number of children: 3  . Years of education: college   Occupational History  . 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 Smoking status: Former Smoker    Types: Cigarettes    Start date: 09/26/2009  . Smokeless tobacco: Never Used     Comment: Quit 2010  . Alcohol use 0.6 oz/week    1 Cans of beer per week     Comment: OCC  . Drug use: No  . Sexual activity: Not on file   Other Topics Concern  . Not on file   Social History Narrative   Patient lives at home with her husband. Melinda Bradley)   Patient has 3 children.   Patient works as a Engineer, maintenance (IT) at [employer].    Patient has a college education.   Caffeine- two cups daily.   Right handed.     PHYSICAL EXAM  Vitals:   10/13/16 1240  BP: (!) 146/98  Pulse: 91  Weight: 171 lb (77.6 kg)   Body mass index is 27.6 kg/m. Generalized: Well developed,  obese female in no acute distress  Head: normocephalic and atraumatic,. Oropharynx benign  Neck: Supple, no carotid bruits  Cardiac: Regular rate rhythm, no murmur  Musculoskeletal: No deformity  Neurological examination  Mentation: Alert oriented to time, place, history taking. Follows all commands speech and language fluent  Cranial nerve II-XII: Pupils were equal round reactive to light extraocular movements were full, visual field were full on confrontational  test. Facial sensation and strength were normal. hearing was intact to finger rubbing bilaterally. Uvula tongue midline. head turning and shoulder shrug were normal and symmetric.Tongue protrusion into cheek strength was normal.  Motor: normal bulk and tone, full strength in the BUE, BLE,. No focal weakness.  Coordination: finger-nose-finger, heel-to-shin bilaterally, no dysmetria  Sensory intact to touch and pinprick and vibratory in the upper and lower extremities Reflexes: Brachioradialis 2/2, biceps 2/2, triceps 2/2, patellar 2/2, Achilles 2/2, plantar responses were flexor bilaterally.  Gait and Station: Rising up from seated position without assistance, normal stance, moderate stride, good arm swing, smooth turning, able to perform tiptoe, and heel walking without difficulty. Tandem gait is steady  DIAGNOSTIC DATA (LABS, IMAGING, TESTING) - ASSESSMENT AND PLAN  61 y.o. year old female  has a past medical history of Migraine; Vertigo;  here to  follow-up for her migraines which are in good control on Topamax and Imitrex acutely. Her headaches are much less frequent since she was diagnosed with obstructive sleep apnea and is using CPAP.  Discussed tapering and discontinuing Topamax.  Decrease Topamax by 25 mg every week until discontinued  Continue Imitrex at current dose, will refill Call for increase in headaches Migraine tracker APP F/U in 1 year I spent 15 min in total face to face time with the patient more than 50% of which was spent counseling and coordination of care, reviewing test results reviewing medications and discussing and reviewing the diagnosis of migraine and further treatment options. Discussed titrating off preventative medication due to tingling in her feet which can be a side effect. She was given a titration schedule. She will call if migraines return and/or Imitrex does not work Nilda Riggs, [facility], [facility], APRN  [facility] [address],  [address] [place], [place] [address] [phone]

## 2016-10-13 NOTE — Progress Notes (Signed)
I have read the note, and I agree with the clinical assessment and plan.  Krish Bailly KEITH   

## 2016-10-13 NOTE — Patient Instructions (Addendum)
Decrease Topamax by 25 mg  every week until discontinued   Continue Imitrex at current dose, will refill Call for increase in headaches Migraine tracker APP F/U in 1 year

## 2016-11-07 ENCOUNTER — Other Ambulatory Visit: Payer: Self-pay | Admitting: Nurse Practitioner

## 2016-11-29 ENCOUNTER — Other Ambulatory Visit: Payer: Self-pay | Admitting: Nurse Practitioner

## 2016-12-01 NOTE — Progress Notes (Signed)
Cardiology Office Note   Date:  12/02/2016   ID:  Melinda Bradley, DOB 1955-11-01, MRN [id number]  PCP:  Sigmund HazelMiller, Lisa, MD    No chief complaint on file. Mitral valve repair  Wt Readings from Last 3 Encounters:  12/02/16 175 lb 6.4 oz (79.6 kg)  10/13/16 171 lb (77.6 kg)  06/11/16 174 lb (78.9 kg)       History of Present Illness: Melinda Bradley is a 61 y.o. female  Who had MV repair in 2011.  She was diagnosed with mild OSA as well.  She is using CPAP.    Her sleep is better.  Her headaches are better.  She tolerates the mask about 4 hours a night.  She retired in June 2017.  She is selling her house with a plan to [place] the next few weeks.  She exercises three times a week.  She walks and lifts lighter weights.  SHe is trying to decrease sugar intake.  Denies : Chest pain. Dizziness. Leg edema. Nitroglycerin use. Orthopnea. Palpitations. Paroxysmal nocturnal dyspnea. Shortness of breath. Syncope.      Past Medical History:  Diagnosis Date  . Heart burn   . Menorrhagia   . Migraine   . Symptomatic menopausal or female climacteric states   . Vertigo     Past Surgical History:  Procedure Laterality Date  . DILATION AND CURETTAGE OF UTERUS    . MITRAL VALVE REPAIR  10/2009  . TONSILLECTOMY       Current Outpatient Prescriptions  Medication Sig Dispense Refill  . aspirin 81 MG tablet Take 81 mg by mouth daily.    Marland Kitchen. buPROPion (WELLBUTRIN XL) 150 MG 24 hr tablet Take 150 mg by mouth daily.  0  . FLUoxetine (PROZAC) 20 MG tablet Take 20 mg by mouth daily.    . metoprolol tartrate (LOPRESSOR) 25 MG tablet Take 1 tablet (25 mg total) by mouth 2 (two) times daily. 60 tablet 11  . omeprazole (PRILOSEC) 20 MG capsule Take 20 mg by mouth as needed.    . pravastatin (PRAVACHOL) 40 MG tablet Take 1 tablet (40 mg total) by mouth every evening. 90 tablet 3  . SUMAtriptan (IMITREX) 100 MG tablet TAKE 1 TABLET (100 MG TOTAL) BY MOUTH AS NEEDED FOR  MIGRAINE. MAY REPEAT IN 2 HOURS IF HEADACHE PERSISTS OR RECURS. 9 tablet 11  . topiramate (TOPAMAX) 25 MG tablet Take 2 tablets (50 mg total) by mouth 2 (two) times daily. 120 tablet 11  . amoxicillin (AMOXIL) 500 MG capsule Take 4 caps by mouth 1 hour prior to dental work (Patient not taking: Reported on 10/13/2016) 4 capsule 1   No current facility-administered medications for this visit.     Allergies:   Patient has no known allergies.    Social History:  The patient  reports that she has quit smoking. Her smoking use included Cigarettes. She started smoking about 7 years ago. She has never used smokeless tobacco. She reports that she drinks about 0.6 oz of alcohol per week . She reports that she does not use drugs.   Family History:  The patient's family history includes Arthritis-Osteo in her sister; Atrial fibrillation in her mother; Headache in her father; Heart Problems in her father; Hiatal hernia in her mother.    ROS:  Please see the history of present illness.   Otherwise, review of systems are positive for difficulty losing weight.   All other systems are reviewed and  negative.    PHYSICAL EXAM: VS:  BP 120/80 (BP Location: Left Arm, Patient Position: Sitting, Cuff Size: Normal)   Pulse 65   Ht 5\' 6"  (1.676 m)   Wt 175 lb 6.4 oz (79.6 kg)   SpO2 98%   BMI 28.31 kg/m  , BMI Body mass index is 28.31 kg/m. GEN: Well nourished, well developed, in no acute distress  HEENT: normal  Neck: no JVD, carotid bruits, or masses;  Cardiac: RRR; no murmurs, rubs, or gallops,no edema  Respiratory:  clear to auscultation bilaterally, normal work of breathing GI: soft, nontender, nondistended, + BS; mildly obese MS: no deformity or atrophy  Skin: warm and dry, no rash Neuro:  Strength and sensation are intact Psych: euthymic mood, full affect   EKG:   The ekg ordered today demonstrates Normal ECG   Recent Labs: No results found for requested labs within last 8760 hours.    Lipid Panel No results found for: CHOL, TRIG, HDL, CHOLHDL, VLDL, LDLCALC, LDLDIRECT   Other studies Reviewed: Additional studies/ records that were reviewed today with results demonstrating: 2016 echo showed normal left ventricular function. Well-functioning mitral valve repair.   ASSESSMENT AND PLAN:  1. H/o MVR: Mitral valve repair appears to be functioning well.  She will need SBE prophylaxis. No signs of heart failure. 2. HTN: Blood pressure well controlled. Continue current antihypertensives. 3. OSA: Continue C Pap.  Continue efforts at weight loss.  I encouraged her to continue with her lifestyle changes of trying to avoid sugar intake. 4. Hyperlipidemia: She was started on pravastatin. She will have follow-up labs with her primary care doctor. 5. She will be moving to [place]. She is not sure if she will continue to follow here, or find a doctor there. She is concerned that her insurance may not be accepted in [place]. She prefers to make an appointment in a year. Her son still lives here so she will be in the area at times.   Current medicines are reviewed at length with the patient today.  The patient concerns regarding her medicines were addressed.  The following changes have been made:  No change  Labs/ tests ordered today include:  No orders of the defined types were placed in this encounter.   Recommend 150 minutes/week of aerobic exercise Low fat, low carb, high fiber diet recommended  Disposition:   FU in one year   Signed, Lance Muss, MD  12/02/2016 11:33 AM    [facility] [address] [place], [place], [place]  [address] Phone: [phone]; Fax: [phone]

## 2016-12-02 ENCOUNTER — Encounter: Payer: Self-pay | Admitting: Interventional Cardiology

## 2016-12-02 ENCOUNTER — Ambulatory Visit (INDEPENDENT_AMBULATORY_CARE_PROVIDER_SITE_OTHER): Payer: BLUE CROSS/BLUE SHIELD | Admitting: Interventional Cardiology

## 2016-12-02 VITALS — BP 120/80 | HR 65 | Ht 66.0 in | Wt 175.4 lb

## 2016-12-02 DIAGNOSIS — E782 Mixed hyperlipidemia: Secondary | ICD-10-CM | POA: Diagnosis not present

## 2016-12-02 DIAGNOSIS — G4730 Sleep apnea, unspecified: Secondary | ICD-10-CM | POA: Diagnosis not present

## 2016-12-02 DIAGNOSIS — E785 Hyperlipidemia, unspecified: Secondary | ICD-10-CM | POA: Insufficient documentation

## 2016-12-02 DIAGNOSIS — I10 Essential (primary) hypertension: Secondary | ICD-10-CM

## 2016-12-02 DIAGNOSIS — I059 Rheumatic mitral valve disease, unspecified: Secondary | ICD-10-CM | POA: Diagnosis not present

## 2016-12-02 MED ORDER — METOPROLOL TARTRATE 25 MG PO TABS: 25.0000 mg | ORAL_TABLET | Freq: Two times a day (BID) | ORAL | 3 refills | Status: AC

## 2016-12-02 NOTE — Patient Instructions (Signed)

## 2016-12-13 DIAGNOSIS — H15832 Staphyloma posticum, left eye: Secondary | ICD-10-CM | POA: Diagnosis not present

## 2016-12-13 DIAGNOSIS — H00029 Hordeolum internum unspecified eye, unspecified eyelid: Secondary | ICD-10-CM | POA: Diagnosis not present

## 2016-12-13 DIAGNOSIS — H10413 Chronic giant papillary conjunctivitis, bilateral: Secondary | ICD-10-CM | POA: Diagnosis not present

## 2016-12-13 DIAGNOSIS — H04123 Dry eye syndrome of bilateral lacrimal glands: Secondary | ICD-10-CM | POA: Diagnosis not present

## 2017-01-10 DIAGNOSIS — G4733 Obstructive sleep apnea (adult) (pediatric): Secondary | ICD-10-CM | POA: Diagnosis not present

## 2017-06-25 DIAGNOSIS — G4733 Obstructive sleep apnea (adult) (pediatric): Secondary | ICD-10-CM | POA: Diagnosis not present

## 2017-10-12 NOTE — Progress Notes (Deleted)
[facility]  PATIENT: Melinda Bradley DOB: 1955/07/14   REASON FOR VISIT: follow up for migraines HISTORY FROM:Patient    HISTORY OF PRESENT ILLNESS:UPDATE 10/13/16 CMMs. Beer, 62 year old female returns for followup.  Her migraines are in  good control with Topamax 50mg   daily. She does complain with some numbness and tingling in her feet. Imitrex works acutely. She has a history of mitral valve replacement in 2010. She has been diagnosed with obstructive sleep apnea and is on CPAP since last seen. Because she is getting better sleep she has less headaches She returns for reevaluation . She needs refills on her medications  HISTORY: She was evaluated for "dizzy spells" since 2007-by Dr. Thad Ranger on 01/12/10. One severe enough to fall out of chair; went to ED, told had panic attack- went away until 2009, recurred, again negative w/u- found to have murmur at that time, eventually has mitral valve repair May 2011- no further spells from 2009 until June, recurred- had several since- can occur at rest, even in sleep- feel unsteady, sweaty, flushed, then very tired for rest of day and next- room spins counterclockwise, nausea after, no subjective nystagmus, no visual change- lasts about and resolves- helps to sit still, use cold rags- no ALOC- no assoc HA; long h/o migraine, better w/ hormone- on metoprolol since surg, just stopped Coumadin- She fell out of the chair with another episode 02/25/10. Topamax was increased to 100mg  daily. EEG was done and was normal. She has migraines and doing well on Topamax. 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 CT angiogram normal in the past. No further events with Topamax.  03/19/13: Patient returns for followup she had a severe migraine last week associated with nausea and vomiting. For whatever reason she has decreased her Topamax to  75 mg daily. Imitrex works acutely but typically she has to take an additional tablet and her pills are 50 mg. She has nothing to take for nausea. She is on Wellbutrin for depression and Prozac was recently added. Her daughter is getting married in 2 weeks and this is stressful for her. She is also going through menopause and has difficulty sleeping    REVIEW OF SYSTEMS: Full 14 system review of systems performed and notable only for those listed, all others are neg:  Constitutional: neg  Cardiovascular: neg Ear/Nose/Throat: neg  Skin: neg Eyes: neg Respiratory: neg Gastroitestinal: neg  Hematology/Lymphatic: neg  Endocrine: neg Musculoskeletal:neg Allergy/Immunology: neg Neurological: Migraine headaches, numbness in the feet Psychiatric: neg Sleep : Obstructive sleep apnea on CPAP   ALLERGIES: No Known Allergies  HOME MEDICATIONS: Outpatient Medications Prior to Visit  Medication Sig Dispense Refill  . amoxicillin (AMOXIL) 500 MG capsule Take 4 caps by mouth 1 hour prior to dental work (Patient not taking: Reported on 10/13/2016) 4 capsule 1  . aspirin 81 MG tablet Take 81 mg by mouth daily.    Marland Kitchen buPROPion (WELLBUTRIN XL) 150 MG 24 hr tablet Take 150 mg by mouth daily.  0  . FLUoxetine (PROZAC) 20 MG tablet Take 20 mg by mouth daily.    . metoprolol tartrate (LOPRESSOR) 25 MG tablet Take 1 tablet (25 mg total) by mouth 2 (two) times daily. 180 tablet 3  . omeprazole (PRILOSEC) 20 MG capsule Take 20 mg by mouth as needed.    . pravastatin (PRAVACHOL) 40 MG tablet Take 1 tablet (40 mg total)  by mouth every evening. 90 tablet 3  . SUMAtriptan (IMITREX) 100 MG tablet TAKE 1 TABLET (100 MG TOTAL) BY MOUTH AS NEEDED FOR MIGRAINE. MAY REPEAT IN 2 HOURS IF HEADACHE PERSISTS OR RECURS. 9 tablet 11  . topiramate (TOPAMAX) 25 MG tablet Take 2 tablets (50 mg total) by mouth 2 (two) times daily. 120 tablet 11   No facility-administered medications prior to visit.     PAST MEDICAL  HISTORY: Past Medical History:  Diagnosis Date  . Heart burn   . Menorrhagia   . Migraine   . Symptomatic menopausal or female climacteric states   . Vertigo     PAST SURGICAL HISTORY: Past Surgical History:  Procedure Laterality Date  . DILATION AND CURETTAGE OF UTERUS    . MITRAL VALVE REPAIR  10/2009  . TONSILLECTOMY      FAMILY HISTORY: Family History  Problem Relation Age of Onset  . Hiatal hernia Mother   . Atrial fibrillation Mother   . Headache Father   . Heart Problems Father   . Arthritis-Osteo Sister     SOCIAL HISTORY: Social History   Socioeconomic History  . Marital status: Married    Spouse name: [name]  . Number of children: 3  . Years of education: college  . Highest education level: Not on file  Occupational History  . Occupation: ASSISTANT DIRECTOR ITS    Employer: [employer]    Comment: 09/2016 retired  Social Needs  . Financial resource strain: Not on file  . Food insecurity:    Worry: Not on file    Inability: Not on file  . Transportation needs:    Medical: Not on file    Non-medical: Not on file  Tobacco Use  . Smoking status: Former Smoker    Types: Cigarettes    Start date: 09/26/2009  . Smokeless tobacco: Never Used  . Tobacco comment: Quit 2010  Substance and Sexual Activity  . Alcohol use: Yes    Alcohol/week: 0.6 oz    Types: 1 Cans of beer per week    Comment: OCC  . Drug use: No  . Sexual activity: Not on file  Lifestyle  . Physical activity:    Days per week: Not on file    Minutes per session: Not on file  . Stress: Not on file  Relationships  . Social connections:    Talks on phone: Not on file    Gets together: Not on file    Attends religious service: Not on file    Active member of club or organization: Not on file    Attends meetings of clubs or organizations: Not on file    Relationship status: Not on file  . Intimate partner violence:    Fear of current or ex partner: Not on file    Emotionally  abused: Not on file    Physically abused: Not on file    Forced sexual activity: Not on file  Other Topics Concern  . Not on file  Social History Narrative   Patient lives at home with her husband. [name]   Patient has 3 children.   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    Patient has a college education.   Caffeine- two cups daily.   Right handed.     PHYSICAL EXAM  There were no vitals filed for this visit. There is no height or weight on file to calculate BMI. Generalized: Well developed,  obese female in no acute  distress  Head: normocephalic and atraumatic,. Oropharynx benign  Neck: Supple, no carotid bruits  Cardiac: Regular rate rhythm, no murmur  Musculoskeletal: No deformity  Neurological examination  Mentation: Alert oriented to time, place, history taking. Follows all commands speech and language fluent  Cranial nerve II-XII: Pupils were equal round reactive to light extraocular movements were full, visual field were full on confrontational test. Facial sensation and strength were normal. hearing was intact to finger rubbing bilaterally. Uvula tongue midline. head turning and shoulder shrug were normal and symmetric.Tongue protrusion into cheek strength was normal.  Motor: normal bulk and tone, full strength in the BUE, BLE,. No focal weakness.  Coordination: finger-nose-finger, heel-to-shin bilaterally, no dysmetria  Sensory intact to touch and pinprick and vibratory in the upper and lower extremities Reflexes: Brachioradialis 2/2, biceps 2/2, triceps 2/2, patellar 2/2, Achilles 2/2, plantar responses were flexor bilaterally.  Gait and Station: Rising up from seated position without assistance, normal stance, moderate stride, good arm swing, smooth turning, able to perform tiptoe, and heel walking without difficulty. Tandem gait is steady  DIAGNOSTIC DATA (LABS, IMAGING, TESTING) - ASSESSMENT AND PLAN  62 y.o. year old female  has a past  medical history of Migraine; Vertigo;  here to follow-up for her migraines which are in good control on Topamax and Imitrex acutely. Her headaches are much less frequent since she was diagnosed with obstructive sleep apnea and is using CPAP.  Discussed tapering and discontinuing Topamax.  Decrease Topamax by 25 mg every week until discontinued  Continue Imitrex at current dose, will refill Call for increase in headaches Migraine tracker APP F/U in 1 year I spent 15 min in total face to face time with the patient more than 50% of which was spent counseling and coordination of care, reviewing test results reviewing medications and discussing and reviewing the diagnosis of migraine and further treatment options. Discussed titrating off preventative medication due to tingling in her feet which can be a side effect. She was given a titration schedule. She will call if migraines return and/or Imitrex does not work Nilda Riggs, [facility], [facility], APRN  [facility] [address] [place], [place] [address] [phone]

## 2017-10-13 ENCOUNTER — Ambulatory Visit: Payer: BLUE CROSS/BLUE SHIELD | Admitting: Nurse Practitioner

## 2017-10-17 ENCOUNTER — Encounter: Payer: Self-pay | Admitting: Nurse Practitioner

## 2019-06-07 ENCOUNTER — Other Ambulatory Visit: Payer: Self-pay | Admitting: Interventional Cardiology

## 2019-06-07 DIAGNOSIS — Z1231 Encounter for screening mammogram for malignant neoplasm of breast: Secondary | ICD-10-CM
